# Patient Record
Sex: Female | Born: 1950 | Hispanic: No | Marital: Single | Smoking: Former smoker
Health system: Southern US, Community
[De-identification: ages and names within clinical notes are randomized; demographics above are authoritative.]

## PROBLEM LIST (undated history)

## (undated) DIAGNOSIS — J45909 Unspecified asthma, uncomplicated: Secondary | ICD-10-CM

## (undated) DIAGNOSIS — R0781 Pleurodynia: Secondary | ICD-10-CM

## (undated) DIAGNOSIS — I10 Essential (primary) hypertension: Secondary | ICD-10-CM

## (undated) DIAGNOSIS — I209 Angina pectoris, unspecified: Secondary | ICD-10-CM

## (undated) DIAGNOSIS — I2510 Atherosclerotic heart disease of native coronary artery without angina pectoris: Secondary | ICD-10-CM

## (undated) DIAGNOSIS — R7303 Prediabetes: Secondary | ICD-10-CM

## (undated) DIAGNOSIS — R0602 Shortness of breath: Secondary | ICD-10-CM

## (undated) DIAGNOSIS — M797 Fibromyalgia: Secondary | ICD-10-CM

## (undated) HISTORY — DX: Pleurodynia: R07.81

## (undated) HISTORY — DX: Unspecified asthma, uncomplicated: J45.909

## (undated) HISTORY — PX: CHOLECYSTECTOMY: SHX55

## (undated) HISTORY — DX: Shortness of breath: R06.02

## (undated) HISTORY — PX: NEPHRECTOMY: SHX65

## (undated) HISTORY — DX: Essential (primary) hypertension: I10

---

## 2015-12-26 ENCOUNTER — Other Ambulatory Visit: Payer: Self-pay

## 2015-12-26 DIAGNOSIS — Z1231 Encounter for screening mammogram for malignant neoplasm of breast: Secondary | ICD-10-CM

## 2016-01-23 ENCOUNTER — Ambulatory Visit
Admission: RE | Admit: 2016-01-23 | Discharge: 2016-01-23 | Disposition: A | Discharge status: Home / Self Care | Payer: 59 | Source: Ambulatory Visit

## 2016-01-23 DIAGNOSIS — Z1231 Encounter for screening mammogram for malignant neoplasm of breast: Secondary | ICD-10-CM

## 2016-02-02 ENCOUNTER — Other Ambulatory Visit: Payer: Self-pay | Admitting: Family Medicine

## 2016-02-02 DIAGNOSIS — R928 Other abnormal and inconclusive findings on diagnostic imaging of breast: Secondary | ICD-10-CM

## 2016-02-08 ENCOUNTER — Ambulatory Visit
Admission: RE | Admit: 2016-02-08 | Discharge: 2016-02-08 | Disposition: A | Discharge status: Home / Self Care | Payer: 59 | Source: Ambulatory Visit | Attending: Family Medicine | Admitting: Family Medicine

## 2016-02-08 DIAGNOSIS — R928 Other abnormal and inconclusive findings on diagnostic imaging of breast: Secondary | ICD-10-CM

## 2016-12-10 ENCOUNTER — Other Ambulatory Visit: Payer: Self-pay | Admitting: Cardiology

## 2016-12-10 DIAGNOSIS — R079 Chest pain, unspecified: Secondary | ICD-10-CM

## 2016-12-30 ENCOUNTER — Encounter (HOSPITAL_COMMUNITY): Payer: 59

## 2017-01-02 ENCOUNTER — Encounter: Payer: Self-pay | Admitting: Cardiovascular Disease

## 2017-01-02 ENCOUNTER — Ambulatory Visit (INDEPENDENT_AMBULATORY_CARE_PROVIDER_SITE_OTHER): Payer: Medicare HMO | Admitting: Cardiovascular Disease

## 2017-01-02 VITALS — BP 126/72 | HR 73 | Ht 67.0 in | Wt 216.2 lb

## 2017-01-02 DIAGNOSIS — J4520 Mild intermittent asthma, uncomplicated: Secondary | ICD-10-CM | POA: Diagnosis not present

## 2017-01-02 DIAGNOSIS — I10 Essential (primary) hypertension: Secondary | ICD-10-CM

## 2017-01-02 DIAGNOSIS — E669 Obesity, unspecified: Secondary | ICD-10-CM | POA: Diagnosis not present

## 2017-01-02 DIAGNOSIS — E1169 Type 2 diabetes mellitus with other specified complication: Secondary | ICD-10-CM | POA: Diagnosis not present

## 2017-01-02 DIAGNOSIS — R0602 Shortness of breath: Secondary | ICD-10-CM | POA: Diagnosis not present

## 2017-01-02 DIAGNOSIS — R079 Chest pain, unspecified: Secondary | ICD-10-CM

## 2017-01-02 DIAGNOSIS — R0781 Pleurodynia: Secondary | ICD-10-CM

## 2017-01-02 NOTE — Progress Notes (Signed)
Cardiology Office Note   Date:  01/12/2017   ID:  Mia Gonzales, DOB 06/29/1951, MRN [id number]  PCP:  Caffie Damme, MD  Cardiologist:   Chilton Si, MD   No chief complaint on file.     History of Present Illness: Mia Gonzales is a 66 y.o. female with hyperlipidemia, asthma, prior tobacco abuse, and diabetes who presents for an evaluation of shortness of breath. Mia Gonzales saw Dr. Caffie Damme on 12/28/16 and reported exertional dyspnea. Her blood pressure was noted to be elevated to 144/88, which was a new finding.  She was referred to cardiology for further evaluation.  She was also concerned because her older sister recently had a heart attack 3 weeks ago.  She also has a sister who had peripartum cardiomyopathy. Mia Gonzales noted an episode of pleuritic chest pain that started one month ago and lasted for 3 days. Mia discomfort was constant throughout that time. There was associated shortness of breath but no nausea or diaphoresis. This occurred in Mia setting of her dog dying.  She has not been exercising regularly but denies exertional chest pain or pressure. She does note some mild lower extremity edema that improves with elevation. She denies orthopnea or PND. She did not have any cold or cough symptoms at Mia time of her chest pain.  Mia Gonzales has several family members with cardiac disease.  She has a sister who had peripartum cardiomyopathy.  Another sister had CAD and a CABG at age 44.  Another sister developed CAD at age 70.  Mia Gonzales quit smoking 12 years ago after smoking 2 ppd for 30 days.   Past Medical History:  Diagnosis Date  . Asthma 01/06/2017  . Essential hypertension 01/06/2017  . Pleuritic chest pain 01/06/2017  . Shortness of breath 01/06/2017    History reviewed. No pertinent surgical history.   Current Outpatient Prescriptions  Medication Sig Dispense Refill  . cetirizine HCl (ZYRTEC) 5 MG/5ML SYRP Take 25 mg by mouth 2 (two) times daily.    . Cholecalciferol  (VITAMIN D3) 10000 units TABS Take 5,000 Units by mouth daily.    . diclofenac (VOLTAREN) 75 MG EC tablet Take 75 mg by mouth 2 (two) times daily.     Marland Kitchen gabapentin (NEURONTIN) 300 MG capsule Take 300 mg by mouth 2 (two) times daily.     . Multiple Vitamins-Minerals (ICAPS AREDS 2 PO) Take 1 tablet by mouth 2 (two) times daily.    Marland Kitchen tetrahydrozoline 0.05 % ophthalmic solution Place 1 drop into both eyes 4 (four) times daily. Equate dry eye relief Otc eye drops    . venlafaxine XR (EFFEXOR-XR) 75 MG 24 hr capsule Take 75 mg by mouth daily with breakfast.     . vitamin B-12 (CYANOCOBALAMIN) 500 MCG tablet Take 500 mcg by mouth daily.     No current facility-administered medications for this visit.     Allergies:   Tramadol-acetaminophen and Codeine    Social History:  Mia Gonzales  reports that she has quit smoking. She has never used smokeless tobacco.   Family History:  Mia Gonzales's family history includes CAD in her sister; Cancer in her father and mother; Cardiomyopathy in her sister; Diabetes in her father and paternal grandmother; Heart attack in her maternal grandfather and sister; Heart disease in her maternal grandfather and maternal grandmother; Rheum arthritis in her mother.    ROS:  Please see Mia history of present illness.   Otherwise, review of systems are positive for  none.   All other systems are reviewed and negative.    PHYSICAL EXAM: VS:  BP 126/72   Pulse 73   Ht 5\' 7"  (1.702 m)   Wt 98.1 kg (216 lb 3.2 oz)   BMI 33.86 kg/m  , BMI Body mass index is 33.86 kg/m. GENERAL:  Well appearing HEENT:  Pupils equal round and reactive, fundi not visualized, oral mucosa unremarkable NECK:  No jugular venous distention, waveform within normal limits, carotid upstroke brisk and symmetric, no bruits, no thyromegaly LYMPHATICS:  No cervical adenopathy LUNGS:  Clear to auscultation bilaterally HEART:  RRR.  PMI not displaced or sustained,S1 and S2 within normal limits, no S3, no  S4, no clicks, no rubs, no murmurs ABD:  Flat, positive bowel sounds normal in frequency in pitch, no bruits, no rebound, no guarding, no midline pulsatile mass, no hepatomegaly, no splenomegaly EXT:  2 plus pulses throughout, no edema, no cyanosis no clubbing SKIN:  No rashes no nodules NEURO:  Cranial nerves II through XII grossly intact, motor grossly intact throughout PSYCH:  Cognitively intact, oriented to person place and time    EKG:  EKG is ordered today. Mia ekg ordered today demonstrates sinus rhythm.  Rate 73 bpm.  PACs.     Recent Labs: No results found for requested labs within last 8760 hours.   11/07/16: A1c 7.2% Total cholesterol 256, triglycerides 314, HDL 33, LDL 169 sodium 141, potassium 4.2, BUN 21, creatinine 1.0 AST 15, ALT 13 WBC 10.1, hemoglobin 13.6, hematocrit 45.1, platelets 257 TSH 1.65 Free T4 1.41  Lipid Panel No results found for: CHOL, TRIG, HDL, CHOLHDL, VLDL, LDLCALC, LDLDIRECT    Wt Readings from Last 3 Encounters:  01/02/17 98.1 kg (216 lb 3.2 oz)      ASSESSMENT AND PLAN:  # Atypical chest pain: # Shortness of breath: Ms. Amburn had atypical symptoms that occurred in Mia setting of her dog dying.  She doesn't have exertional symptoms, so it is unlikely that this is due to ischemia.  We will get an ETT to better evaluate.  She has no evidence of heart failure on exam.  # Elevated blood pressure: Ms. Forget's BP is normal today but was elevated with her PCP.  Will continue to monitor.     Current medicines are reviewed at length with Mia Gonzales today.  Mia Gonzales does not have concerns regarding medicines.  Mia following changes have been made:  no change  Labs/ tests ordered today include:   Orders Placed This Encounter  Procedures  . Exercise Tolerance Test  . EKG 12-Lead     Disposition:   FU with Zeinab Rodwell C. Duke Salvia, MD, [facility] in 3 months.     This note was written with Mia assistance of speech recognition software.   Please excuse any transcriptional errors.  Signed, Raif Chachere C. Duke Salvia, MD, [facility]  01/12/2017 10:57 PM    [facility]

## 2017-01-02 NOTE — Patient Instructions (Signed)
Medication Instructions:  Your physician recommends that you continue on your current medications as directed. Please refer to the Current Medication list given to you today.  Labwork: NONE  Testing/Procedures: Your physician has requested that you have an exercise tolerance test. For further information please visit [email or web address] Please also follow instruction sheet, as given.  Follow-Up: Your physician wants you to follow-up in: 3 MONTH OV You will receive a reminder letter in the mail two months in advance. If you don't receive a letter, please call our office to schedule the follow-up appointment.  If you need a refill on your cardiac medications before your next appointment, please call your pharmacy.

## 2017-01-06 ENCOUNTER — Encounter: Payer: Self-pay | Admitting: Cardiovascular Disease

## 2017-01-06 DIAGNOSIS — I10 Essential (primary) hypertension: Secondary | ICD-10-CM

## 2017-01-06 DIAGNOSIS — J45909 Unspecified asthma, uncomplicated: Secondary | ICD-10-CM

## 2017-01-06 DIAGNOSIS — R0602 Shortness of breath: Secondary | ICD-10-CM

## 2017-01-06 DIAGNOSIS — E1169 Type 2 diabetes mellitus with other specified complication: Secondary | ICD-10-CM | POA: Insufficient documentation

## 2017-01-06 DIAGNOSIS — R0781 Pleurodynia: Secondary | ICD-10-CM | POA: Insufficient documentation

## 2017-01-06 DIAGNOSIS — E669 Obesity, unspecified: Secondary | ICD-10-CM

## 2017-01-06 HISTORY — DX: Pleurodynia: R07.81

## 2017-01-06 HISTORY — DX: Essential (primary) hypertension: I10

## 2017-01-06 HISTORY — DX: Unspecified asthma, uncomplicated: J45.909

## 2017-01-06 HISTORY — DX: Shortness of breath: R06.02

## 2017-01-09 ENCOUNTER — Telehealth (HOSPITAL_COMMUNITY): Payer: Self-pay

## 2017-01-09 NOTE — Telephone Encounter (Signed)
Encounter complete. 

## 2017-01-10 NOTE — Telephone Encounter (Signed)
complete

## 2017-01-14 ENCOUNTER — Ambulatory Visit (HOSPITAL_COMMUNITY)
Admission: RE | Admit: 2017-01-14 | Discharge: 2017-01-14 | Disposition: A | Discharge status: Home / Self Care | Payer: Medicare HMO | Source: Ambulatory Visit | Attending: Cardiovascular Disease | Admitting: Cardiovascular Disease

## 2017-01-14 DIAGNOSIS — R079 Chest pain, unspecified: Secondary | ICD-10-CM

## 2017-01-14 LAB — EXERCISE TOLERANCE TEST
CHL CUP MPHR: 154 {beats}/min
CSEPEDS: 46 s
CSEPHR: 92 %
CSEPPHR: 142 {beats}/min
Estimated workload: 7 METS
Exercise duration (min): 5 min
RPE: 19
Rest HR: 73 {beats}/min

## 2017-01-30 ENCOUNTER — Encounter: Payer: Self-pay | Admitting: *Deleted

## 2017-01-30 ENCOUNTER — Telehealth: Payer: Self-pay | Admitting: *Deleted

## 2017-01-30 NOTE — Telephone Encounter (Addendum)
-----   Message from Chilton Si, MD sent at 01/22/2017  4:50 PM EDT ----- Labs reviewed.  Cholesterol is quite elevated.  Recommend limiting fried and fatty foods.  Increase exercise to at least 30-40 minutes most days of the week.  Start rosuvastatin 40 mg daily.  Left message for pt to call

## 2017-01-30 NOTE — Telephone Encounter (Signed)
Spoke with pt, aware of dr Leonides Sake recommendations. She does not want a statin medication. She has changed her diet and is exercising more. She will discuss with dr Duke Salvia at next office visit.

## 2017-01-30 NOTE — Telephone Encounter (Signed)
New Message ° ° pt verbalized that she is returning call for rn °

## 2017-01-30 NOTE — Telephone Encounter (Signed)
This encounter was created in error - please disregard.

## 2017-04-08 ENCOUNTER — Ambulatory Visit: Payer: Medicare HMO | Admitting: Cardiovascular Disease

## 2017-04-09 ENCOUNTER — Other Ambulatory Visit: Payer: Self-pay | Admitting: Urology

## 2017-04-09 DIAGNOSIS — N2889 Other specified disorders of kidney and ureter: Secondary | ICD-10-CM

## 2017-08-16 ENCOUNTER — Inpatient Hospital Stay (HOSPITAL_COMMUNITY)
Admission: EM | Admit: 2017-08-16 | Discharge: 2017-08-19 | DRG: 247 | Disposition: A | Discharge status: Home / Self Care | Payer: Medicare HMO | Attending: Internal Medicine | Admitting: Internal Medicine

## 2017-08-16 ENCOUNTER — Other Ambulatory Visit: Payer: Self-pay

## 2017-08-16 ENCOUNTER — Encounter (HOSPITAL_COMMUNITY): Payer: Self-pay | Admitting: Emergency Medicine

## 2017-08-16 ENCOUNTER — Emergency Department (HOSPITAL_COMMUNITY): Payer: Medicare HMO

## 2017-08-16 DIAGNOSIS — E785 Hyperlipidemia, unspecified: Secondary | ICD-10-CM | POA: Diagnosis present

## 2017-08-16 DIAGNOSIS — Z885 Allergy status to narcotic agent status: Secondary | ICD-10-CM

## 2017-08-16 DIAGNOSIS — J019 Acute sinusitis, unspecified: Secondary | ICD-10-CM | POA: Diagnosis present

## 2017-08-16 DIAGNOSIS — Z87891 Personal history of nicotine dependence: Secondary | ICD-10-CM

## 2017-08-16 DIAGNOSIS — I129 Hypertensive chronic kidney disease with stage 1 through stage 4 chronic kidney disease, or unspecified chronic kidney disease: Secondary | ICD-10-CM | POA: Diagnosis present

## 2017-08-16 DIAGNOSIS — N179 Acute kidney failure, unspecified: Secondary | ICD-10-CM | POA: Diagnosis present

## 2017-08-16 DIAGNOSIS — I214 Non-ST elevation (NSTEMI) myocardial infarction: Principal | ICD-10-CM | POA: Diagnosis present

## 2017-08-16 DIAGNOSIS — R402252 Coma scale, best verbal response, oriented, at arrival to emergency department: Secondary | ICD-10-CM | POA: Diagnosis present

## 2017-08-16 DIAGNOSIS — Z8261 Family history of arthritis: Secondary | ICD-10-CM

## 2017-08-16 DIAGNOSIS — R402142 Coma scale, eyes open, spontaneous, at arrival to emergency department: Secondary | ICD-10-CM | POA: Diagnosis present

## 2017-08-16 DIAGNOSIS — Z833 Family history of diabetes mellitus: Secondary | ICD-10-CM

## 2017-08-16 DIAGNOSIS — Z85528 Personal history of other malignant neoplasm of kidney: Secondary | ICD-10-CM

## 2017-08-16 DIAGNOSIS — E7800 Pure hypercholesterolemia, unspecified: Secondary | ICD-10-CM | POA: Diagnosis present

## 2017-08-16 DIAGNOSIS — R079 Chest pain, unspecified: Secondary | ICD-10-CM | POA: Diagnosis present

## 2017-08-16 DIAGNOSIS — M797 Fibromyalgia: Secondary | ICD-10-CM | POA: Diagnosis present

## 2017-08-16 DIAGNOSIS — N182 Chronic kidney disease, stage 2 (mild): Secondary | ICD-10-CM | POA: Diagnosis present

## 2017-08-16 DIAGNOSIS — Z8249 Family history of ischemic heart disease and other diseases of the circulatory system: Secondary | ICD-10-CM

## 2017-08-16 DIAGNOSIS — I358 Other nonrheumatic aortic valve disorders: Secondary | ICD-10-CM | POA: Diagnosis present

## 2017-08-16 DIAGNOSIS — Z807 Family history of other malignant neoplasms of lymphoid, hematopoietic and related tissues: Secondary | ICD-10-CM

## 2017-08-16 DIAGNOSIS — Z955 Presence of coronary angioplasty implant and graft: Secondary | ICD-10-CM

## 2017-08-16 DIAGNOSIS — Z905 Acquired absence of kidney: Secondary | ICD-10-CM

## 2017-08-16 DIAGNOSIS — R402362 Coma scale, best motor response, obeys commands, at arrival to emergency department: Secondary | ICD-10-CM | POA: Diagnosis present

## 2017-08-16 DIAGNOSIS — I25110 Atherosclerotic heart disease of native coronary artery with unstable angina pectoris: Secondary | ICD-10-CM | POA: Diagnosis present

## 2017-08-16 DIAGNOSIS — E1122 Type 2 diabetes mellitus with diabetic chronic kidney disease: Secondary | ICD-10-CM | POA: Diagnosis present

## 2017-08-16 DIAGNOSIS — Z9049 Acquired absence of other specified parts of digestive tract: Secondary | ICD-10-CM

## 2017-08-16 DIAGNOSIS — J45909 Unspecified asthma, uncomplicated: Secondary | ICD-10-CM | POA: Diagnosis present

## 2017-08-16 HISTORY — DX: Fibromyalgia: M79.7

## 2017-08-16 LAB — BASIC METABOLIC PANEL
Anion gap: 11 (ref 5–15)
BUN: 25 mg/dL — ABNORMAL HIGH (ref 6–20)
CHLORIDE: 96 mmol/L — AB (ref 101–111)
CO2: 26 mmol/L (ref 22–32)
Calcium: 9 mg/dL (ref 8.9–10.3)
Creatinine, Ser: 1.44 mg/dL — ABNORMAL HIGH (ref 0.44–1.00)
GFR calc non Af Amer: 37 mL/min — ABNORMAL LOW (ref >60)
GFR, EST AFRICAN AMERICAN: 42 mL/min — AB (ref >60)
Glucose, Bld: 339 mg/dL — ABNORMAL HIGH (ref 65–99)
POTASSIUM: 4.3 mmol/L (ref 3.5–5.1)
SODIUM: 133 mmol/L — AB (ref 135–145)

## 2017-08-16 LAB — I-STAT TROPONIN, ED: Troponin i, poc: 0 ng/mL (ref 0.00–0.08)

## 2017-08-16 LAB — CBC
HEMATOCRIT: 43.6 % (ref 36.0–46.0)
Hemoglobin: 14.3 g/dL (ref 12.0–15.0)
MCH: 27.4 pg (ref 26.0–34.0)
MCHC: 32.8 g/dL (ref 30.0–36.0)
MCV: 83.5 fL (ref 78.0–100.0)
PLATELETS: 263 10*3/uL (ref 150–400)
RBC: 5.22 MIL/uL — AB (ref 3.87–5.11)
RDW: 14.5 % (ref 11.5–15.5)
WBC: 16.5 10*3/uL — AB (ref 4.0–10.5)

## 2017-08-16 LAB — D-DIMER, QUANTITATIVE (NOT AT ARMC): D DIMER QUANT: 0.51 ug{FEU}/mL — AB (ref 0.00–0.50)

## 2017-08-16 LAB — GLUCOSE, CAPILLARY: GLUCOSE-CAPILLARY: 225 mg/dL — AB (ref 65–99)

## 2017-08-16 LAB — TROPONIN I: TROPONIN I: 0.03 ng/mL — AB (ref <0.03)

## 2017-08-16 MED ORDER — ASPIRIN EC 81 MG PO TBEC
81.0000 mg | DELAYED_RELEASE_TABLET | Freq: Every day | ORAL | Status: DC
  Administered 2017-08-17 – 2017-08-19 (×2): 81 mg via ORAL
  Filled 2017-08-16 (×2): qty 1

## 2017-08-16 MED ORDER — ACETAMINOPHEN 325 MG PO TABS
650.0000 mg | ORAL_TABLET | Freq: Four times a day (QID) | ORAL | Status: DC | PRN
  Administered 2017-08-16 – 2017-08-18 (×3): 650 mg via ORAL
  Filled 2017-08-16 (×3): qty 2

## 2017-08-16 MED ORDER — PROMETHAZINE HCL 25 MG PO TABS: 12.5000 mg | ORAL_TABLET | Freq: Four times a day (QID) | ORAL | Status: DC | PRN

## 2017-08-16 MED ORDER — VENLAFAXINE HCL ER 75 MG PO CP24
75.0000 mg | ORAL_CAPSULE | Freq: Once | ORAL | Status: AC
  Administered 2017-08-16: 75 mg via ORAL
  Filled 2017-08-16: qty 1

## 2017-08-16 MED ORDER — GABAPENTIN 300 MG PO CAPS
300.0000 mg | ORAL_CAPSULE | Freq: Two times a day (BID) | ORAL | Status: DC
  Administered 2017-08-16 – 2017-08-19 (×6): 300 mg via ORAL
  Filled 2017-08-16 (×6): qty 1

## 2017-08-16 MED ORDER — PROCHLORPERAZINE EDISYLATE 5 MG/ML IJ SOLN
10.0000 mg | Freq: Once | INTRAMUSCULAR | Status: AC
  Administered 2017-08-16: 10 mg via INTRAVENOUS
  Filled 2017-08-16: qty 2

## 2017-08-16 MED ORDER — LACTATED RINGERS IV BOLUS (SEPSIS)
1000.0000 mL | Freq: Once | INTRAVENOUS | Status: AC
  Administered 2017-08-16: 1000 mL via INTRAVENOUS

## 2017-08-16 MED ORDER — INSULIN ASPART 100 UNIT/ML ~~LOC~~ SOLN
0.0000 [IU] | Freq: Three times a day (TID) | SUBCUTANEOUS | Status: DC
  Administered 2017-08-17: 2 [IU] via SUBCUTANEOUS
  Administered 2017-08-19: 07:00:00 1 [IU] via SUBCUTANEOUS

## 2017-08-16 MED ORDER — PANTOPRAZOLE SODIUM 40 MG PO TBEC
40.0000 mg | DELAYED_RELEASE_TABLET | Freq: Every day | ORAL | Status: DC
  Administered 2017-08-16 – 2017-08-19 (×4): 40 mg via ORAL
  Filled 2017-08-16 (×4): qty 1

## 2017-08-16 MED ORDER — NITROGLYCERIN 0.4 MG SL SUBL
0.4000 mg | SUBLINGUAL_TABLET | SUBLINGUAL | Status: DC | PRN
  Administered 2017-08-16 – 2017-08-18 (×4): 0.4 mg via SUBLINGUAL
  Filled 2017-08-16 (×4): qty 1

## 2017-08-16 MED ORDER — HEPARIN SODIUM (PORCINE) 5000 UNIT/ML IJ SOLN
5000.0000 [IU] | Freq: Three times a day (TID) | INTRAMUSCULAR | Status: DC
  Administered 2017-08-16 – 2017-08-17 (×2): 5000 [IU] via SUBCUTANEOUS
  Filled 2017-08-16 (×2): qty 1

## 2017-08-16 MED ORDER — SODIUM CHLORIDE 0.9% FLUSH
3.0000 mL | Freq: Two times a day (BID) | INTRAVENOUS | Status: DC
  Administered 2017-08-16 – 2017-08-17 (×2): 3 mL via INTRAVENOUS

## 2017-08-16 MED ORDER — DICLOFENAC SODIUM 1 % TD GEL
2.0000 g | Freq: Three times a day (TID) | TRANSDERMAL | Status: DC | PRN
  Administered 2017-08-16: 2 g via TOPICAL
  Filled 2017-08-16 (×2): qty 100

## 2017-08-16 MED ORDER — VENLAFAXINE HCL ER 75 MG PO CP24
75.0000 mg | ORAL_CAPSULE | Freq: Every day | ORAL | Status: DC
  Administered 2017-08-17 – 2017-08-18 (×2): 75 mg via ORAL
  Filled 2017-08-16 (×3): qty 1

## 2017-08-16 MED ORDER — ACETAMINOPHEN 650 MG RE SUPP: 650.0000 mg | Freq: Four times a day (QID) | RECTAL | Status: DC | PRN

## 2017-08-16 MED ORDER — ASPIRIN 81 MG PO CHEW
324.0000 mg | CHEWABLE_TABLET | Freq: Once | ORAL | Status: AC
  Administered 2017-08-16: 324 mg via ORAL
  Filled 2017-08-16: qty 4

## 2017-08-16 MED ORDER — DIPHENHYDRAMINE HCL 25 MG PO CAPS
25.0000 mg | ORAL_CAPSULE | Freq: Once | ORAL | Status: AC
  Administered 2017-08-16: 25 mg via ORAL
  Filled 2017-08-16: qty 1

## 2017-08-16 NOTE — H&P (Signed)
Date: 08/16/2017               Patient Name:  Mia Gonzales MRN: [id number]  DOB: 07/08/1951 Age / Sex: 66 y.o., female   PCP: Patient, No Pcp Per         Medical Service: Internal Medicine Teaching Service         Attending Physician: Dr. Doneen PoissonKlima, Lawrence, MD    First Contact: Dr. Lorenso CourierVahini Percell Lamboy Pager: [phone]  Second Contact: Dr. Deneise LeverParth Saraiya Pager: [phone]       After Hours (After 5p/  First Contact Pager: [phone]  weekends / holidays): Second Contact Pager: [phone]   Chief Complaint: Chest pain  History of Present Illness: [name] is a 66 y.o f with past medical history of essential hypertension, fibromyalgia, renal cell carcinoma s/p nephrectomy 03/2017, and asthma who presents with chest pain.  Patient states that she has had been having 3 weeks of chest pain that lasts 20 minutes usually, is intermittently present, 10/10 in intensity, present in the anterior chest and radiating to the right arm and bilateral jaws, tight in nature, and is non-pleuritic.The patient's chest pain does worsen with changes in position and the pain is accompanied with shortness of breath, diaphoresis, and nausea. She states that she does not have any accompanied palpitations, vomiting, or feeling of impending doom. She denies any heavy lifting, but states that she has occasional indigestion. The patient states that the nitroglycerin which she was given in the ED did help alleviate the pain.   The patient had an exercise tolerance test done in April 2018 which did not show any st segment deviation during the stress portion of the exam.   The patient was seen [facility] on 08/09/17 where she was diagnosed with costochondritis and given a prescription for capsaicin cream. The patient saw her pcp earlier this year and was diagnosed with sinusitis based on her frontal headaches, pain behind her ears, and occasional sneezing. She was prescribed cefdinir for 10 days.   ED course: EKG and chest  x-ray done Lactated ringer 1L, nitroglycerin 0.4 mg sublingual given  Meds:  Current Meds  Medication Sig  . cefdinir (OMNICEF) 300 MG capsule Take 300 mg by mouth 2 (two) times daily. Started 08/11/17  . Cholecalciferol (VITAMIN D3) 10000 units TABS Take 5,000 Units by mouth daily.  . diclofenac (VOLTAREN) 75 MG EC tablet Take 75 mg by mouth 2 (two) times daily.   Marland Kitchen. gabapentin (NEURONTIN) 300 MG capsule Take 300 mg by mouth 2 (two) times daily.   . Multiple Vitamins-Minerals (ICAPS AREDS 2 PO) Take 1 tablet by mouth 2 (two) times daily.  . predniSONE (DELTASONE) 10 MG tablet Take 10 mg by mouth. Started 08/15/17 4tab x 3days  3tab x 3days 2tab x 3days 1tab x 3days  . venlafaxine XR (EFFEXOR-XR) 75 MG 24 hr capsule Take 75 mg by mouth daily with breakfast.      Allergies: Allergies as of 08/16/2017 - Review Complete 08/16/2017  Allergen Reaction Noted  . Tramadol-acetaminophen    . Codeine Palpitations    Past Medical History:  Diagnosis Date  . Asthma 01/06/2017  . Essential hypertension 01/06/2017  . Fibromyalgia   . Pleuritic chest pain 01/06/2017  . Shortness of breath 01/06/2017    Family History:   Diabetes mellitus-Father Cardiomyopathy-sister Double bypass-sister Hodgkin's lymphoma-mother Prostate cancer with metastasis to the bone-father  Social History:  Lives alone Quit smoking 15 years ago.  Smokes 2 pack PPD  for 30 years Denies alcohol use Denies drug use  Review of Systems: A complete ROS was negative except as per HPI.   Physical Exam: Blood pressure (!) 164/72, pulse 93, temperature 97.9 F (36.6 C), temperature source Oral, resp. rate 13, height 5\' 7"  (1.702 m), weight 208 lb (94.3 kg), SpO2 97 %.  Physical Exam  Constitutional: She appears well-developed and well-nourished.  Non-toxic appearance. She does not appear ill.  HENT:  Head: Normocephalic and atraumatic.  No maxillary sinus tenderness  Cardiovascular: Normal rate, regular rhythm and  normal pulses.  Chest wall tenderness  Pulmonary/Chest: Effort normal and breath sounds normal. No accessory muscle usage. No respiratory distress.  Abdominal: Soft. Bowel sounds are normal. She exhibits no distension. There is no tenderness.  Lymphadenopathy:    She has no cervical adenopathy.  Neurological: She is alert.  Psychiatric: She has a normal mood and affect. Her behavior is normal. Her mood appears not anxious. She is not agitated.    Troponin (Point of Care Test) Recent Labs    08/16/17 1445  TROPIPOC 0.00   CBC    Component Value Date/Time   WBC 16.5 (H) 08/16/2017 1424   RBC 5.22 (H) 08/16/2017 1424   HGB 14.3 08/16/2017 1424   HCT 43.6 08/16/2017 1424   PLT 263 08/16/2017 1424   MCV 83.5 08/16/2017 1424   MCH 27.4 08/16/2017 1424   MCHC 32.8 08/16/2017 1424   RDW 14.5 08/16/2017 1424   BMP Latest Ref Rng & Units 08/16/2017  Glucose 65 - 99 mg/dL 960(A)  BUN 6 - 20 mg/dL 54(U)  Creatinine 9.81 - 1.00 mg/dL 1.91(Y)  Sodium 782 - 956 mmol/L 133(L)  Potassium 3.5 - 5.1 mmol/L 4.3  Chloride 101 - 111 mmol/L 96(L)  CO2 22 - 32 mmol/L 26  Calcium 8.9 - 10.3 mg/dL 9.0   O-ZHYQM=5.78  EKG: ST depression noted in V2, V4 to V6 shows inverted T waves   CXR: Active cardiopulmonary disease seen  Assessment & Plan by Problem:  66 y.o f with pmh of fibromyalgia, rcc, htn, hld, dm, and asthma who presents with a 3 week history of chest pain radiating down right arm and to the jaw. The patient's risk factors place significant concern for an ACS rule out and if negative consideration for costochondritis, GERD, pulmonary embolism, muscle sprain.   Chest pain The patient presents with a 3-week history of chest pain that is present intermittently.  The chest pain seems noncardiac in etiology as i-STAT troponin negative, chest wall tenderness was present, and the patient has had a long duration of the chest pain. However, the patient does have significant risk factors of  hypertension, obesity, positive family history, and age >45yrs which cause concern for the chest pain to be of cardiac origin. Furthermore, the patient has a heart score of 4 (age>65 and >3 risk factors) which place her at a 12-16.6% risk of a major cardiac event. The patient's EKG showed some ST depression in lead II. Chest x-ray was unremarkable. Previous stress test done April 2018 was negative for an ischemic changes. Have low suspicion for PE based on wells criteria being 0.   Based on the history the patient's chest pain is likely musculoskeletal in etiology and she would benefit from voltaren gel and protonix.   -D-dimer=0.51, patient is not tachypnic, tachycardic, or hypoxic. Wells score  -Consulted cardiology- will monitor troponins overnight and consult in morning (08/17/17) based on lab results -Continuous cardiac monitoring -Pantoprazole 40 mg daily -Trend  troponin every 6 hours -HbA1c pending -Lipid panel pending -Voltaren gel  Fibromyalgia -Continue gabapentin 300mg  bid -Continue venlafaxine 75mg  daily  Acute Sinusitis The patient was diagnosed with sinusitis on 08/11/17 and placed on cefdinir 300mg  bid. The patient's wbc=16.5 might be due to the sinus infection. The patient is afebrile.   -Continue cefdinir 300mg  bid   Hypertension The patient's blood pressure since admission has ranged 149-188/82-83  Diabetes Mellitus Type 2 The patient is not on any home anti-hyperglycemic agents. However, the patient has a glucose of 339 on admission today.   -pending Hba1c -SSI  Acute on chronic kidney injury The patient's creatinine on admission was 1.44 and baseline is 0.8-1.2 per chart review.   -Patient was given Lactated ringer 1L bolus  Dispo: Admit patient to Inpatient with expected length of stay greater than 2 midnights.  Signed: Lorenso Courier, MD Internal Medicine PGY1 [phone] 08/16/2017, 6:03 PM

## 2017-08-16 NOTE — ED Notes (Signed)
Pt c/o chest pain; 10/10, tightness and achy going behind bilat ears, jaw and right side of back; Pt says "I've never had that before." Pt states that pain is constant and c/o HA. Pt states the pain is worse when she is ambulatory.  Pt states that another MD diagnosed with Costochondritis. Pt states that she is on Prednisone. Pt denies smoking; DM, HTN. Pt c/o nausea only when ambulatory and some SOB.  MD at bedside.

## 2017-08-16 NOTE — ED Triage Notes (Addendum)
Pt c/o chest pain started this am at 1130, midsternal, radiating to left arm and neck. Pt was seen at [facility] last week, received capsacien cream, prednisone, Cefdinir, for same.  Pt had right nephrectomy 3 months ago for kidney cancer.

## 2017-08-16 NOTE — Progress Notes (Addendum)
Pt c/o 10/10 chest pain radiating to jaw and bilateral shoulders down to right arm.   2135. Nitro given  2138. Chest pain 6/10. Now c/o headache 10/10. Per oncall, okay to take sips with meds. Will administer tylenol PO.

## 2017-08-16 NOTE — ED Notes (Signed)
Admitting at bedside 

## 2017-08-16 NOTE — ED Notes (Signed)
Attempted report x1. 

## 2017-08-16 NOTE — ED Provider Notes (Signed)
[facility] EMERGENCY DEPARTMENT Provider Note   CSN: [id number] Arrival date & time: 08/16/17  1413     History   Chief Complaint Chief Complaint  Patient presents with  . Chest Pain    HPI Mia Gonzales is a 66 y.o. female.  HPI 66 year old female with a history of hypertension, asthma, diabetes, fibromyalgia presenting with 3 weeks of worsening chest pain and headache.  She states her headache has been gradual in onset and not described as worse of life.  Is mostly frontal and throbbing.  Associated with photophobia with no vision changes, numbness, weakness.  No recent falls or trauma.  Patient is also had a tightness pain in her chest that radiates to her right shoulder and arm that worsens on exertion and improves with rest. Associated with SOB. Denies diaphoresis or n/v. No history of cardiac disease.  Patient had a right radical nephrectomy 3 months ago for renal cell carcinoma.  Was seen recently at [facility] and told she had costochondritis and then saw her PCP and was placed on prednisone.  Past Medical History:  Diagnosis Date  . Asthma 01/06/2017  . Essential hypertension 01/06/2017  . Fibromyalgia   . Pleuritic chest pain 01/06/2017  . Shortness of breath 01/06/2017    Patient Active Problem List   Diagnosis Date Noted  . Chest pain 08/16/2017  . Essential hypertension 01/06/2017  . Asthma 01/06/2017  . Diabetes mellitus type 2 in obese (HCC) 01/06/2017  . Shortness of breath 01/06/2017  . Pleuritic chest pain 01/06/2017    Past Surgical History:  Procedure Laterality Date  . CHOLECYSTECTOMY    . NEPHRECTOMY      OB History    No data available       Home Medications    Prior to Admission medications   Medication Sig Start Date End Date Taking? Authorizing Provider  cefdinir (OMNICEF) 300 MG capsule Take 300 mg by mouth 2 (two) times daily. Started 08/11/17 08/11/17  Yes [provider]  Cholecalciferol (VITAMIN D3) 10000  units TABS Take 5,000 Units by mouth daily.   Yes [provider]  diclofenac (VOLTAREN) 75 MG EC tablet Take 75 mg by mouth 2 (two) times daily.  10/25/16  Yes [provider]  gabapentin (NEURONTIN) 300 MG capsule Take 300 mg by mouth 2 (two) times daily.  10/25/16  Yes [provider]  Multiple Vitamins-Minerals (ICAPS AREDS 2 PO) Take 1 tablet by mouth 2 (two) times daily.   Yes [provider]  predniSONE (DELTASONE) 10 MG tablet Take 10 mg by mouth. Started 08/15/17 4tab x 3days  3tab x 3days 2tab x 3days 1tab x 3days 08/15/17  Yes [provider]  venlafaxine XR (EFFEXOR-XR) 75 MG 24 hr capsule Take 75 mg by mouth daily with breakfast.  05/22/16  Yes [provider]    Family History Family History  Problem Relation Age of Onset  . Rheum arthritis Mother   . Cancer Mother   . Diabetes Father   . Cancer Father   . Heart disease Maternal Grandfather   . Heart attack Maternal Grandfather   . Heart disease Maternal Grandmother   . Diabetes Paternal Grandmother   . Heart attack Sister   . Cardiomyopathy Sister   . CAD Sister     Social History Social History   Tobacco Use  . Smoking status: Former Games developer  . Smokeless tobacco: Never Used  Substance Use Topics  . Alcohol use: No  Frequency: Never  . Drug use: No     Allergies   Tramadol-acetaminophen and Codeine   Review of Systems Review of Systems  Constitutional: Negative for chills and fever.  HENT: Negative for ear pain and sore throat.   Eyes: Negative for pain and visual disturbance.  Respiratory: Positive for shortness of breath. Negative for cough.   Cardiovascular: Positive for chest pain. Negative for palpitations.  Gastrointestinal: Negative for abdominal pain and vomiting.  Genitourinary: Negative for dysuria and hematuria.  Musculoskeletal: Negative for arthralgias and back pain.  Skin: Negative for color change and rash.  Neurological: Positive for  headaches. Negative for dizziness, seizures, syncope, weakness and numbness.  All other systems reviewed and are negative.    Physical Exam Updated Vital Signs BP (!) 164/72   Pulse 93   Temp 97.9 F (36.6 C) (Oral)   Resp 13   Ht 5\' 7"  (1.702 m)   Wt 94.3 kg (208 lb)   SpO2 97%   BMI 32.58 kg/m   Physical Exam  Constitutional: She is oriented to person, place, and time. She appears well-developed and well-nourished. No distress.  HENT:  Head: Normocephalic and atraumatic.  Eyes: Conjunctivae and EOM are normal. Pupils are equal, round, and reactive to light.  Neck: Neck supple.  Cardiovascular: Normal rate, regular rhythm, S1 normal, S2 normal, normal heart sounds, intact distal pulses and normal pulses. Exam reveals no gallop, no friction rub and no decreased pulses.  No murmur heard. Pulmonary/Chest: Effort normal and breath sounds normal. No respiratory distress.  Abdominal: Soft. There is no tenderness.  Musculoskeletal: She exhibits no edema.  Neurological: She is alert and oriented to person, place, and time. GCS eye subscore is 4. GCS verbal subscore is 5. GCS motor subscore is 6.  5/5 strength in all extremities. Sensation intact throughout. CN2-12 grossly intact. Coordination intact with finger to nose. Normal gait.  Skin: Skin is warm and dry.  Psychiatric: She has a normal mood and affect.  Nursing note and vitals reviewed.    ED Treatments / Results  Labs (all labs ordered are listed, but only abnormal results are displayed) Labs Reviewed  BASIC METABOLIC PANEL - Abnormal; Notable for the following components:      Result Value   Sodium 133 (*)    Chloride 96 (*)    Glucose, Bld 339 (*)    BUN 25 (*)    Creatinine, Ser 1.44 (*)    GFR calc non Af Amer 37 (*)    GFR calc Af Amer 42 (*)    All other components within normal limits  CBC - Abnormal; Notable for the following components:   WBC 16.5 (*)    RBC 5.22 (*)    All other components within normal  limits  D-DIMER, QUANTITATIVE (NOT AT [facility]) - Abnormal; Notable for the following components:   D-Dimer, Quant 0.51 (*)    All other components within normal limits  TROPONIN I  TROPONIN I  TROPONIN I  LIPID PANEL  HEMOGLOBIN A1C  I-STAT TROPONIN, ED    EKG  EKG Interpretation  Date/Time:  Saturday August 16 2017 14:17:16 EST Ventricular Rate:  95 PR Interval:  138 QRS Duration: 78 QT Interval:  342 QTC Calculation: 429 R Axis:   70 Text Interpretation:  Normal sinus rhythm Nonspecific ST and T wave abnormality Abnormal ECG Confirmed by Blane Ohara [phone]) on 08/16/2017 5:20:49 PM       Radiology Dg Chest 2 View  Result Date: 08/16/2017 CLINICAL  DATA:  66 year old female with chest pain, midsternal radiating to left arm and neck. EXAM: CHEST  2 VIEW COMPARISON:  None. FINDINGS: The heart size and mediastinal contours are within normal limits. Both lungs are clear. The visualized skeletal structures are unremarkable. IMPRESSION: No active cardiopulmonary disease. Electronically Signed   By: Sande BrothersSerena  Chacko M.D.   On: 08/16/2017 15:23    Procedures Procedures (including critical care time)  Medications Ordered in ED Medications  nitroGLYCERIN (NITROSTAT) SL tablet 0.4 mg (0.4 mg Sublingual Given 08/16/17 1645)  lactated ringers bolus 1,000 mL (0 mLs Intravenous Paused 08/16/17 1839)  pantoprazole (PROTONIX) EC tablet 40 mg (40 mg Oral Given 08/16/17 1839)  diphenhydrAMINE (BENADRYL) capsule 25 mg (25 mg Oral Given 08/16/17 1735)  prochlorperazine (COMPAZINE) injection 10 mg (10 mg Intravenous Given 08/16/17 1735)  aspirin chewable tablet 324 mg (324 mg Oral Given 08/16/17 1839)     Initial Impression / Assessment and Plan / ED Course  I have reviewed the triage vital signs and the nursing notes.  Pertinent labs & imaging results that were available during my care of the patient were reviewed by me and considered in my medical decision making (see chart for  details).     66 year old female with the above history presenting with chest pain and a headache.  Headache has been gradual in onset over 3 weeks and not described the source of life.  Normal neurologic exam.  Afebrile.  Doubt SAH/ICH.  No recent trauma. Chest pain resolved with nitro.  Aspirin given.  EKG with nonspecific T wave changes in the lateral leads.  She was tachycardic but not hypoxic.  No clinical evidence of DVT. Labs with a mild elevation in her creatinine at 1.4.  Initial troponin negative.  D-dimer ordered to rule out PE.  D-dimer is 0.5 which age-adjusted is negative.  Chest x-ray is grossly unremarkable with no evidence of pneumonia, pneumothorax, widened mediastinum cardiomegaly. Heart score is 5. Patient given Compazine and Benadryl for headache.  Fluid bolus given for AK I.  Patient will be admitted to medicine for ACS rule out. Pt amenable with plan  Final Clinical Impressions(s) / ED Diagnoses   Final diagnoses:  Chest pain, unspecified type    ED Discharge Orders    None       Khadir Roam Italyhad, MD 08/16/17 [id number]    Blane OharaZavitz, Joshua, MD 08/16/17 2326

## 2017-08-16 NOTE — ED Notes (Signed)
Pt states that chest pain relieved by nitro x 1 dose; still c/o of HA

## 2017-08-17 ENCOUNTER — Other Ambulatory Visit (HOSPITAL_COMMUNITY): Payer: Medicare HMO

## 2017-08-17 DIAGNOSIS — E7800 Pure hypercholesterolemia, unspecified: Secondary | ICD-10-CM | POA: Diagnosis not present

## 2017-08-17 DIAGNOSIS — I10 Essential (primary) hypertension: Secondary | ICD-10-CM | POA: Diagnosis not present

## 2017-08-17 DIAGNOSIS — I214 Non-ST elevation (NSTEMI) myocardial infarction: Secondary | ICD-10-CM

## 2017-08-17 DIAGNOSIS — R079 Chest pain, unspecified: Secondary | ICD-10-CM

## 2017-08-17 LAB — CBC
HCT: 40 % (ref 36.0–46.0)
HEMATOCRIT: 40 % (ref 36.0–46.0)
Hemoglobin: 12.6 g/dL (ref 12.0–15.0)
Hemoglobin: 12.8 g/dL (ref 12.0–15.0)
MCH: 26.7 pg (ref 26.0–34.0)
MCH: 26.9 pg (ref 26.0–34.0)
MCHC: 31.5 g/dL (ref 30.0–36.0)
MCHC: 32 g/dL (ref 30.0–36.0)
MCV: 84.7 fL (ref 78.0–100.0)
MCV: 84.2 fL (ref 78.0–100.0)
PLATELETS: 234 10*3/uL (ref 150–400)
PLATELETS: 223 10*3/uL (ref 150–400)
RBC: 4.72 MIL/uL (ref 3.87–5.11)
RBC: 4.75 MIL/uL (ref 3.87–5.11)
RDW: 15 % (ref 11.5–15.5)
RDW: 14.8 % (ref 11.5–15.5)
WBC: 14.7 10*3/uL — ABNORMAL HIGH (ref 4.0–10.5)
WBC: 15.7 10*3/uL — AB (ref 4.0–10.5)

## 2017-08-17 LAB — LIPID PANEL
CHOL/HDL RATIO: 6.6 ratio
CHOLESTEROL: 205 mg/dL — AB (ref 0–200)
HDL: 31 mg/dL — ABNORMAL LOW (ref >40)
LDL Cholesterol: 121 mg/dL — ABNORMAL HIGH (ref 0–99)
TRIGLYCERIDES: 267 mg/dL — AB (ref <150)
VLDL: 53 mg/dL — ABNORMAL HIGH (ref 0–40)

## 2017-08-17 LAB — COMPREHENSIVE METABOLIC PANEL
ALK PHOS: 85 U/L (ref 38–126)
ALT: 18 U/L (ref 14–54)
AST: 18 U/L (ref 15–41)
Albumin: 3.3 g/dL — ABNORMAL LOW (ref 3.5–5.0)
Anion gap: 8 (ref 5–15)
BUN: 28 mg/dL — ABNORMAL HIGH (ref 6–20)
CALCIUM: 8.4 mg/dL — AB (ref 8.9–10.3)
CHLORIDE: 103 mmol/L (ref 101–111)
CO2: 26 mmol/L (ref 22–32)
Creatinine, Ser: 1.18 mg/dL — ABNORMAL HIGH (ref 0.44–1.00)
GFR, EST AFRICAN AMERICAN: 54 mL/min — AB (ref >60)
GFR, EST NON AFRICAN AMERICAN: 47 mL/min — AB (ref >60)
Glucose, Bld: 127 mg/dL — ABNORMAL HIGH (ref 65–99)
Potassium: 3.7 mmol/L (ref 3.5–5.1)
Sodium: 137 mmol/L (ref 135–145)
Total Bilirubin: 0.5 mg/dL (ref 0.3–1.2)
Total Protein: 6.3 g/dL — ABNORMAL LOW (ref 6.5–8.1)

## 2017-08-17 LAB — TROPONIN I
TROPONIN I: 0.13 ng/mL — AB (ref <0.03)
TROPONIN I: 0.08 ng/mL — AB (ref <0.03)
TROPONIN I: 0.13 ng/mL — AB (ref <0.03)
Troponin I: 0.17 ng/mL (ref <0.03)
Troponin I: 0.14 ng/mL (ref <0.03)

## 2017-08-17 LAB — GLUCOSE, CAPILLARY
GLUCOSE-CAPILLARY: 142 mg/dL — AB (ref 65–99)
GLUCOSE-CAPILLARY: 152 mg/dL — AB (ref 65–99)
Glucose-Capillary: 168 mg/dL — ABNORMAL HIGH (ref 65–99)
Glucose-Capillary: 155 mg/dL — ABNORMAL HIGH (ref 65–99)

## 2017-08-17 LAB — HEPARIN LEVEL (UNFRACTIONATED): Heparin Unfractionated: 0.38 IU/mL (ref 0.30–0.70)

## 2017-08-17 MED ORDER — HEPARIN (PORCINE) IN NACL 100-0.45 UNIT/ML-% IJ SOLN
1000.0000 [IU]/h | INTRAMUSCULAR | Status: DC
  Administered 2017-08-17: 850 [IU]/h via INTRAVENOUS
  Administered 2017-08-18: 1000 [IU]/h via INTRAVENOUS
  Filled 2017-08-17 (×2): qty 250

## 2017-08-17 MED ORDER — METOPROLOL TARTRATE 25 MG PO TABS
25.0000 mg | ORAL_TABLET | Freq: Two times a day (BID) | ORAL | Status: DC
  Administered 2017-08-17 (×2): 25 mg via ORAL
  Filled 2017-08-17 (×3): qty 1

## 2017-08-17 MED ORDER — ATORVASTATIN CALCIUM 40 MG PO TABS
40.0000 mg | ORAL_TABLET | Freq: Every day | ORAL | Status: DC
  Administered 2017-08-17 – 2017-08-18 (×2): 40 mg via ORAL
  Filled 2017-08-17 (×2): qty 1

## 2017-08-17 MED ORDER — HEPARIN BOLUS VIA INFUSION
2000.0000 [IU] | Freq: Once | INTRAVENOUS | Status: AC
  Administered 2017-08-17: 2000 [IU] via INTRAVENOUS
  Filled 2017-08-17: qty 2000

## 2017-08-17 NOTE — Progress Notes (Signed)
   Subjective: Mia Gonzales was seen laying in her bed this morning.  She states that she is currently doing well without any chest pain or shortness of breath.  However, the patient reports an episode of 10/10 chest pain that occurred overnight and was relieved with nitroglycerin.  The patient states that the chest pain was not pleuritic, but was positional.  Objective:  Vital signs in last 24 hours: Vitals:   08/16/17 2004 08/16/17 2134 08/16/17 2150 08/17/17 0552  BP: (!) 162/75 (!) 174/91 (!) 146/69 129/74  Pulse: 78 95 86 66  Resp: 18   20  Temp: 98.1 F (36.7 C)   98 F (36.7 C)  TempSrc: Oral   Oral  SpO2: 100%   95%  Weight:    216 lb 9.6 oz (98.2 kg)  Height:       Physical Exam  Constitutional: She appears well-developed and well-nourished.  Non-toxic appearance. She does not appear ill.  HENT:  Head: Normocephalic and atraumatic.  Cardiovascular: Normal rate, regular rhythm and normal pulses.  Chest wall tenderness to palpation  Pulmonary/Chest: Effort normal and breath sounds normal. No accessory muscle usage. No respiratory distress. She has no decreased breath sounds.  Abdominal: Soft. Bowel sounds are normal.  Musculoskeletal: Normal range of motion.  Neurological: She is alert.  Psychiatric: She has a normal mood and affect. Her behavior is normal. Her mood appears not anxious. She is not agitated.   Assessment/Plan:  Unstable angina/NSTEMI The patient presents with a 3-week history of chest pain that is present intermittently. Troponin has been uptrending from 0,0.03, 0.08, 0.17.  Accompanied EKG changes with some ST depression noted in lead II also concerning for ACS.  -Cardiology consulted they have ordered Myoview stress test  -Continue heparin drip -Pending TTE -Trend troponin every 6 hours until the troponins have peaked or start downtrending -Continuous cardiac monitoring -Pantoprazole 40 mg daily -HbA1c pending -Lipid panel: Tcholesterol=205,  triglycerides=267, LDL=121, HDL=31 -Voltaren gel -Continue atorvastatin 40 mg daily continue aspirin 324 mg  Essential hypertension The patient's blood pressure since admission has ranged 149-188/82-83 -Started metoprolol 25 mg twice daily  ?Diabetes mellitus The patient has a questionable diagnosis of diabetes and she is not taking any hypoglycemic agents at home.  She denies any symptoms of polyuria, polydipsia, polyphagia. The patient states that she has had some weight gain.   The patient's fasting blood sugar this morning was 142.  Her random blood glucose measurements have been ranging 168-339  Another fasting blood glucose level>126 or hemoglobin A1c>6.5 to be elevated for an official diagnosis of diabetes to be made.  -Pending HbA1C  Fibromyalgia -Continue gabapentin 300mg  bid -Continue venlafaxine 75mg  daily  Dispo: Anticipated discharge in approximately 1-2 days.   Lorenso Courier, MD Internal Medicine PGY1 Pager:[phone] 08/17/2017, 12:14 PM

## 2017-08-17 NOTE — Progress Notes (Signed)
Paged by nursing @[phone]  that patient experienced 8/10 substernal CP radiating to her right arm when she got up and walked to the bathroom. Vitals were stable with BP 129/67, HR 59. Went to bedside and patient said the pain was relieved by SL nitro and it was the same pain she has been experiencing since admission. Repeat EKG was obtained and showed no ST elevations or evidence of acute ischemia when compared to prior ECG this AM. Troponin level will be obtained,  will follow up result.   Juluis Mire, MD Internal Medicine PGY1 Pager # [phone]

## 2017-08-17 NOTE — Progress Notes (Addendum)
ANTICOAGULATION CONSULT NOTE - Follow-up  Pharmacy Consult for Heparin  Indication: chest pain/ACS  Allergies  Allergen Reactions  . Tramadol-Acetaminophen     Other reaction(s): Vomiting  . Codeine Palpitations   Patient Measurements: Height: 5\' 7"  (170.2 cm) Weight: 216 lb 9.6 oz (98.2 kg) IBW/kg (Calculated) : 61.6  Vital Signs: Temp: 98 F (36.7 C) (11/25 0552) Temp Source: Oral (11/25 0552) BP: 129/74 (11/25 0552) Pulse Rate: 66 (11/25 0552)  Labs: Recent Labs    08/16/17 1424  08/17/17 0015 08/17/17 0749 08/17/17 1244  HGB 14.3  --   --  12.6  --   HCT 43.6  --   --  40.0  --   PLT 263  --   --  234  --   HEPARINUNFRC  --   --   --   --  0.38  CREATININE 1.44*  --   --  1.18*  --   TROPONINI  --    < > 0.08* 0.17* 0.14*   < > = values in this interval not displayed.   Estimated Creatinine Clearance: 55.7 mL/min (A) (by C-G formula based on SCr of 1.18 mg/dL (H)).  Medical History: Past Medical History:  Diagnosis Date  . Asthma 01/06/2017  . Essential hypertension 01/06/2017  . Fibromyalgia   . Pleuritic chest pain 01/06/2017  . Shortness of breath 01/06/2017   Assessment: 66 y/o F with CP and mildly elevated troponin, likely unstable angina/NSTEMI. CBC wnl. Noted renal dysfunction (has one kidney). Not on PTA anticoagulation.  First 6-hr heparin level is therapeutic at 0.38. No bleeding reported.   Goal of Therapy:  Heparin level 0.3-0.7 units/ml Monitor platelets by anticoagulation protocol: Yes   Plan:  Continue Heparin gtt at 850 units/hr Daily CBC/HL Monitor for bleeding  Adline Potter, PharmD Pharmacy Resident Pager: [phone]

## 2017-08-17 NOTE — Progress Notes (Signed)
Pt c/o 8/10 CP, mid sternal pain radiating to right arm. States pain started when ambulating to bathroom. Pt states this pain mimics the same pain that she has been having and what brought her to the hospital. Pt states pain usually resolves with SL nitro. BP 129/67 (87), 1 SL nitro given. EKG obtained-normal EKG. 1 SL nitro has decreased pain from 8/10 to 0/10. MD (IM Teaching Service on call) now at bedside. New lab orders obtained. Will continue to monitor closely.  Viviano Simas, RN

## 2017-08-17 NOTE — Progress Notes (Signed)
Progress Note  Patient Name: Mia Gonzales Date of Encounter: 08/17/2017  Primary Cardiologist: Duke Salvia  Subjective   No pain currently   Inpatient Medications    Scheduled Meds: . aspirin EC  81 mg Oral Daily  . gabapentin  300 mg Oral BID  . insulin aspart  0-9 Units Subcutaneous TID WC  . metoprolol tartrate  25 mg Oral BID  . pantoprazole  40 mg Oral Daily  . sodium chloride flush  3 mL Intravenous Q12H  . venlafaxine XR  75 mg Oral Q breakfast   Continuous Infusions: . heparin 850 Units/hr (08/17/17 0656)   PRN Meds: acetaminophen **OR** acetaminophen, diclofenac sodium, nitroGLYCERIN, promethazine   Vital Signs    Vitals:   08/16/17 2004 08/16/17 2134 08/16/17 2150 08/17/17 0552  BP: (!) 162/75 (!) 174/91 (!) 146/69 129/74  Pulse: 78 95 86 66  Resp: 18   20  Temp: 98.1 F (36.7 C)   98 F (36.7 C)  TempSrc: Oral   Oral  SpO2: 100%   95%  Weight:    216 lb 9.6 oz (98.2 kg)  Height:        Intake/Output Summary (Last 24 hours) at 08/17/2017 0915 Last data filed at 08/17/2017 0200 Gross per 24 hour  Intake 3 ml  Output 0 ml  Net 3 ml   Filed Weights   08/16/17 2001 08/16/17 2002 08/17/17 0552  Weight: 215 lb 3.2 oz (97.6 kg) 215 lb 3.2 oz (97.6 kg) 216 lb 9.6 oz (98.2 kg)    Telemetry    NSR no arrhythmia - Personally Reviewed  ECG    SR no acute ST changes - Personally Reviewed  Physical Exam  Obes white female  GEN: No acute distress.   Neck: No JVD Cardiac: RRR, no murmurs, rubs, or gallops.  Respiratory: Clear to auscultation bilaterally. GI: Soft, nontender, non-distended large nephrectomy/GB scar  MS: No edema; No deformity. Neuro:  Nonfocal  Psych: Normal affect   Labs    Chemistry Recent Labs  Lab 08/16/17 1424  NA 133*  K 4.3  CL 96*  CO2 26  GLUCOSE 339*  BUN 25*  CREATININE 1.44*  CALCIUM 9.0  GFRNONAA 37*  GFRAA 42*  ANIONGAP 11     Hematology Recent Labs  Lab 08/16/17 1424 08/17/17 0749  WBC 16.5*  14.7*  RBC 5.22* 4.72  HGB 14.3 12.6  HCT 43.6 40.0  MCV 83.5 84.7  MCH 27.4 26.7  MCHC 32.8 31.5  RDW 14.5 15.0  PLT 263 234    Cardiac Enzymes Recent Labs  Lab 08/16/17 1845 08/17/17 0015  TROPONINI 0.03* 0.08*    Recent Labs  Lab 08/16/17 1445  TROPIPOC 0.00     BNPNo results for input(s): BNP, PROBNP in the last 168 hours.   DDimer  Recent Labs  Lab 08/16/17 1635  DDIMER 0.51*     Radiology    Dg Chest 2 View  Result Date: 08/16/2017 CLINICAL DATA:  66 year old female with chest pain, midsternal radiating to left arm and neck. EXAM: CHEST  2 VIEW COMPARISON:  None. FINDINGS: The heart size and mediastinal contours are within normal limits. Both lungs are clear. The visualized skeletal structures are unremarkable. IMPRESSION: No active cardiopulmonary disease. Electronically Signed   By: Sande Brothers M.D.   On: 08/16/2017 15:23    Cardiac Studies   Echo pending ETT 4/254/18 normal   Patient Profile     66 y.o. female with atypical chest pain Prolonged with negative troponin  and no ECG Changes Nephrectomy with single kidney and Cr around 1.4 Normal ETT 12/2016  Assessment & Plan    Chest Pain: Atypical r/o have ordered Ex myovue for am try to avoid contrast load Echo is pending for today   CRF:  Post nephrectomy with "cure" will need to be hydrated if cath needed  For questions or updates, please contact [facility] Please consult [email or web address] for contact info under Cardiology/STEMI.      Signed, Charlton HawsPeter Aela Bohan, MD  08/17/2017, 9:15 AM

## 2017-08-17 NOTE — H&P (Signed)
Internal Medicine Attending Admission Note Date: 08/17/2017  Patient name: Mia Gonzales Medical record number: [id number] Date of birth: 04/27/1951 Age: 66 y.o. Gender: female  I saw and evaluated the patient. I reviewed the resident's note and I agree with the resident's findings and plan as documented in the resident's note.  Chief Complaint(s): Intermittent chest pain with exertion 3 weeks.  History - key components related to admission:  Ms. Mia Gonzales is a 66 year old woman with a history of essential hypertension, asthma, fibromyalgia, hyperlipidemia, possible diabetes, previous tobacco abuse and renal cell carcinoma status post nephrectomy in July 2018 who presents with a three-week history of intermittent exertional chest pain. She describes the pain is 10/10 substernal tightness that radiates to the right arm and jaw associated with shortness of breath, diaphoresis, and nausea. The pain resolves with rest. She has another pain that is brought on by palpation of the chest wall and has previously been diagnosed as costochondritis. In addition to her hypertension, hyperlipidemia, prior tobacco use, and possible newly diagnosed diabetes she has a family history of premature coronary artery disease with diagnosis in her sister while in her 30s. She presented to the emergency department with an episode of chest pain which was relieved with sublingual nitroglycerin. She was admitted to the internal medicine teaching service for further evaluation and care.  When seen on rounds the morning after admission she denied any further chest pain and had no acute complaints. Her serial troponins peaked at 0.17. Prior exercise tolerance test in April 2018 was interpreted as negative.  Physical Exam - key components related to admission:  Vitals:   08/16/17 2134 08/16/17 2150 08/17/17 0552 08/17/17 1455  BP: (!) 174/91 (!) 146/69 129/74 95/80  Pulse: 95 86 66 (!) 58  Resp:   20   Temp:   98 F (36.7 C) 98 F  (36.7 C)  TempSrc:   Oral Oral  SpO2:   95% 97%  Weight:   216 lb 9.6 oz (98.2 kg)   Height:       Gen.: Well-developed, well-nourished, woman lying comfortably bed in no acute distress. Lungs: Clear to auscultation bilaterally without wheezes, rhonchi, rales. Heart: Regular rate and rhythm without murmurs, rubs, or gallops. Abdomen: Soft, nontender, active bowel sounds without guarding. Extremities: Without edema.  Lab results:  Basic Metabolic Panel: Recent Labs    08/16/17 1424 08/17/17 0749  NA 133* 137  K 4.3 3.7  CL 96* 103  CO2 26 26  GLUCOSE 339* 127*  BUN 25* 28*  CREATININE 1.44* 1.18*  CALCIUM 9.0 8.4*   Liver Function Tests: Recent Labs    08/17/17 0749  AST 18  ALT 18  ALKPHOS 85  BILITOT 0.5  PROT 6.3*  ALBUMIN 3.3*   CBC: Recent Labs    08/16/17 1424 08/17/17 0749  WBC 16.5* 14.7*  HGB 14.3 12.6  HCT 43.6 40.0  MCV 83.5 84.7  PLT 263 234   Cardiac Enzymes: Recent Labs    08/17/17 0015 08/17/17 0749 08/17/17 1244  TROPONINI 0.08* 0.17* 0.14*   D-Dimer: Recent Labs    08/16/17 1635  DDIMER 0.51*   CBG: Recent Labs    08/16/17 2152 08/17/17 0733 08/17/17 1113  GLUCAP 225* 142* 168*   Fasting Lipid Panel: Recent Labs    08/17/17 0015  CHOL 205*  HDL 31*  LDLCALC 121*  TRIG 267*  CHOLHDL 6.6   Misc. Labs:  Hemoglobin A1c pending  Imaging results:   PA and lateral chest x-ray: No effusions,  infiltrates, or masses.  Other results:  EKG: Normal sinus rhythm at 95 bpm, normal axis, normal intervals, no significant Q waves, no LVH by voltage, good R wave progression, nonspecific ST and T-wave changes. The admission ECG was not significantly different from the ECG on 01/02/2017. The ECG today had a more defined T-wave inversion pattern in the anterolateral leads.  Assessment & Plan by Problem:  Mia Gonzales is a 66 year old woman with a history of essential hypertension, asthma, fibromyalgia, hyperlipidemia, possible  diabetes, previous tobacco abuse and renal cell carcinoma status post nephrectomy in July 2018 who presents with a three-week history of intermittent exertional chest pain. Her pain is somewhat atypical and she has a mild troponin leak but does not have any acute ECG changes. Given her multiple cardiac risk factors, her presentation, and her mild troponin leak she may have had a very mild non-ST elevation myocardial infarction. Further evaluation is warranted and we appreciate cardiology's input. Given the fact that she has only one kidney, and the ever present concern for contrast nephropathy, Cardiology feels it would be more prudent to start with a Myoview to assess for reversible ischemia.  1) Very mild non-ST elevation myocardial infarction: She has been placed on IV heparin. We will continue the aspirin. Atorvastatin 40 mg by mouth daily has been started as the high intensity statin. For her blood pressure we have started metoprolol 25 mg by mouth twice daily. She is scheduled to undergo a Myoview tomorrow. We will also obtain an echocardiogram to look for any structural abnormalities or focal akinesis. She has had one fasting blood sugar over 125 although recently had been on prednisone. The prednisone has been discontinued. Thus, a hemoglobin A1c is pending. If she meets criteria for diabetes and requires anti-glycemic medications these will be started once it has been determined she does not need contrast or after successfully navigating a contrasted study.  2) Disposition: Pending the results of the Myoview and need for any further evaluation.

## 2017-08-17 NOTE — Progress Notes (Addendum)
ANTICOAGULATION CONSULT NOTE - Initial Consult  Pharmacy Consult for Heparin  Indication: chest pain/ACS  Allergies  Allergen Reactions  . Tramadol-Acetaminophen     Other reaction(s): Vomiting  . Codeine Palpitations   Patient Measurements: Height: 5\' 7"  (170.2 cm) Weight: 216 lb 9.6 oz (98.2 kg) IBW/kg (Calculated) : 61.6  Vital Signs: Temp: 98 F (36.7 C) (11/25 0552) Temp Source: Oral (11/25 0552) BP: 129/74 (11/25 0552) Pulse Rate: 66 (11/25 0552)  Labs: Recent Labs    08/16/17 1424 08/16/17 1845 08/17/17 0015  HGB 14.3  --   --   HCT 43.6  --   --   PLT 263  --   --   CREATININE 1.44*  --   --   TROPONINI  --  0.03* 0.08*   Estimated Creatinine Clearance: 45.6 mL/min (A) (by C-G formula based on SCr of 1.44 mg/dL (H)).  Medical History: Past Medical History:  Diagnosis Date  . Asthma 01/06/2017  . Essential hypertension 01/06/2017  . Fibromyalgia   . Pleuritic chest pain 01/06/2017  . Shortness of breath 01/06/2017   Assessment: 66 y/o F with CP and mildly elevated troponin. CBC good. Noted renal dysfunction. PTA meds reviewed.   Goal of Therapy:  Heparin level 0.3-0.7 units/ml Monitor platelets by anticoagulation protocol: Yes   Plan:  Heparin 2000 units BOLUS-reduced bolus with recent subcutaneous heparin Start heparin drip at 850 units/hr 1500 HL Daily CBC/HL Monitor for bleeding   Abran Duke 08/17/2017,6:46 AM

## 2017-08-17 NOTE — Plan of Care (Signed)
Oriented to room and unit. POC discussed. Verbalizes understanding.

## 2017-08-17 NOTE — Progress Notes (Signed)
Internal Medicine Attending  Date: 08/17/2017  Patient name: Mia Gonzales Medical record number: [id number] Date of birth: 24-Oct-1950 Age: 66 y.o. Gender: female  I saw and evaluated the patient. I reviewed the resident's note by Dr. Delma Officer and I agree with the resident's findings and plans as documented in her progress note.  Please see my H&P dated 08/17/2017 for the specifics of my evaluation, assessment, and plan from earlier in the day.

## 2017-08-17 NOTE — Consult Note (Signed)
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  Notes 3 weeks history of chest pain.  Sometimes radiates to the jaws.  It is positional and worsens with palpation.  She was admitted to the medicine residency service for this.  Initial troponin was normal.  Subsequently, rose to 0.03 and 0.08.  ECG sinus with non-diagnostic ST/T changes.  D dimer 0.51.  CRT 1.44.  WBC 16.5.  Overnight she had an episode of pain which was relieved by SL NTG.  S/P 324 ASA. On questioning; there may be 2 different pains, her fibromyalgia/costochondritis pain and an exertional chest pain as well relieved by rest.  This second version appears to be getting worse more recently. No edema.  She has healed up form her nephrectomy and is cancer free per her.  No SOB.    Previous Cardiac Studies: Normal ETT April 2018  Past Medical History:  Diagnosis Date  . Asthma 01/06/2017  . Essential hypertension 01/06/2017  . Fibromyalgia   . Pleuritic chest pain 01/06/2017  . Shortness of breath 01/06/2017    Past Surgical History:  Procedure Laterality Date  . CHOLECYSTECTOMY    . NEPHRECTOMY      Family History  Problem Relation Age of Onset  . Rheum arthritis Mother   . Cancer Mother   . Diabetes Father   . Cancer Father   . Heart disease Maternal Grandfather   . Heart attack Maternal Grandfather   . Heart disease Maternal Grandmother   . Diabetes Paternal Grandmother   . Heart attack Sister   . Cardiomyopathy Sister   . CAD Sister    Social History:  reports that she has quit smoking. she has never used smokeless tobacco. She reports that she does not drink alcohol or use drugs.  Allergies:  Allergies  Allergen Reactions  .  Tramadol-Acetaminophen     Other reaction(s): Vomiting  . Codeine Palpitations    No current facility-administered medications on file prior to encounter.    Current Outpatient Medications on File Prior to Encounter  Medication Sig Dispense Refill  . cefdinir (OMNICEF) 300 MG capsule Take 300 mg by mouth 2 (two) times daily. Started 08/11/17    . Cholecalciferol (VITAMIN D3) 10000 units TABS Take 5,000 Units by mouth daily.    . diclofenac (VOLTAREN) 75 MG EC tablet Take 75 mg by mouth 2 (two) times daily.     Marland Kitchen gabapentin (NEURONTIN) 300 MG capsule Take 300 mg by mouth 2 (two) times daily.     . Multiple Vitamins-Minerals (ICAPS AREDS 2 PO) Take 1 tablet by mouth 2 (two) times daily.    . predniSONE (DELTASONE) 10 MG tablet Take 10 mg by mouth. Started 08/15/17 4tab x 3days  3tab x 3days 2tab x 3days 1tab x 3days    . venlafaxine XR (EFFEXOR-XR) 75 MG 24 hr capsule Take 75 mg by mouth daily with breakfast.       Results for orders placed or performed during the hospital encounter of 08/16/17 (from the past 48 hour(s))  Basic metabolic panel     Status: Abnormal   Collection Time: 08/16/17  2:24 PM  Result Value Ref Range   Sodium 133 (L) 135 - 145 mmol/L   Potassium 4.3  3.5 - 5.1 mmol/L   Chloride 96 (L) 101 - 111 mmol/L   CO2 26 22 - 32 mmol/L   Glucose, Bld 339 (H) 65 - 99 mg/dL   BUN 25 (H) 6 - 20 mg/dL   Creatinine, Ser 1.44 (H) 0.44 - 1.00 mg/dL   Calcium 9.0 8.9 - 10.3 mg/dL   GFR calc non Af Amer 37 (L) >60 mL/min   GFR calc Af Amer 42 (L) >60 mL/min    Comment: (NOTE) The eGFR has been calculated using the CKD EPI equation. This calculation has not been validated in all clinical situations. eGFR's persistently <60 mL/min signify possible Chronic Kidney Disease.    Anion gap 11 5 - 15  CBC     Status: Abnormal   Collection Time: 08/16/17  2:24 PM  Result Value Ref Range   WBC 16.5 (H) 4.0 - 10.5 K/uL   RBC 5.22 (H) 3.87 - 5.11 MIL/uL   Hemoglobin 14.3 12.0 - 15.0  g/dL   HCT 43.6 36.0 - 46.0 %   MCV 83.5 78.0 - 100.0 fL   MCH 27.4 26.0 - 34.0 pg   MCHC 32.8 30.0 - 36.0 g/dL   RDW 14.5 11.5 - 15.5 %   Platelets 263 150 - 400 K/uL  I-stat troponin, ED     Status: None   Collection Time: 08/16/17  2:45 PM  Result Value Ref Range   Troponin i, poc 0.00 0.00 - 0.08 ng/mL   Comment 3            Comment: Due to the release kinetics of cTnI, a negative result within the first hours of the onset of symptoms does not rule out myocardial infarction with certainty. If myocardial infarction is still suspected, repeat the test at appropriate intervals.   D-dimer, quantitative (not at [facility])     Status: Abnormal   Collection Time: 08/16/17  4:35 PM  Result Value Ref Range   D-Dimer, Quant 0.51 (H) 0.00 - 0.50 ug/mL-FEU    Comment: (NOTE) At the manufacturer cut-off of 0.50 ug/mL FEU, this assay has been documented to exclude PE with a sensitivity and negative predictive value of 97 to 99%.  At this time, this assay has not been approved by the FDA to exclude DVT/VTE. Results should be correlated with clinical presentation.   Troponin I (q 6hr x 3)     Status: Abnormal   Collection Time: 08/16/17  6:45 PM  Result Value Ref Range   Troponin I 0.03 (HH) <0.03 ng/mL    Comment: CRITICAL RESULT CALLED TO, READ BACK BY AND VERIFIED WITH: J.TSOUTIS,RN 1947 11.24.18 SPIKESN    Glucose, capillary     Status: Abnormal   Collection Time: 08/16/17  9:52 PM  Result Value Ref Range   Glucose-Capillary 225 (H) 65 - 99 mg/dL  Troponin I (q 6hr x 3)     Status: Abnormal   Collection Time: 08/17/17 12:15 AM  Result Value Ref Range   Troponin I 0.08 (HH) <0.03 ng/mL    Comment: CRITICAL VALUE NOTED.  VALUE IS CONSISTENT WITH PREVIOUSLY REPORTED AND CALLED VALUE.  Lipid panel     Status: Abnormal   Collection Time: 08/17/17 12:15 AM  Result Value Ref Range   Cholesterol 205 (H) 0 - 200 mg/dL   Triglycerides 267 (H) <150 mg/dL   HDL 31 (L) >40 mg/dL   Total  CHOL/HDL Ratio 6.6 RATIO   VLDL 53 (H) 0 - 40 mg/dL   LDL Cholesterol 121 (H)  0 - 99 mg/dL    Comment:        Total Cholesterol/HDL:CHD Risk Coronary Heart Disease Risk Table                     Men   Women  1/2 Average Risk   3.4   3.3  Average Risk       5.0   4.4  2 X Average Risk   9.6   7.1  3 X Average Risk  23.4   11.0        Use the calculated Patient Ratio above and the CHD Risk Table to determine the patient's CHD Risk.        ATP III CLASSIFICATION (LDL):  <100     mg/dL   Optimal  100-129  mg/dL   Near or Above                    Optimal  130-159  mg/dL   Borderline  160-189  mg/dL   High  >190     mg/dL   Very High    Dg Chest 2 View  Result Date: 08/16/2017 CLINICAL DATA:  66 year old female with chest pain, midsternal radiating to left arm and neck. EXAM: CHEST  2 VIEW COMPARISON:  None. FINDINGS: The heart size and mediastinal contours are within normal limits. Both lungs are clear. The visualized skeletal structures are unremarkable. IMPRESSION: No active cardiopulmonary disease. Electronically Signed   By: Kristopher Oppenheim M.D.   On: 08/16/2017 15:23    ECG/TELE: NSR with non specific ST/T changes  ROS: As above. Otherwise, review of systems is negative unless per above HPI  Vitals:   08/16/17 2004 08/16/17 2134 08/16/17 2150 08/17/17 0552  BP: (!) 162/75 (!) 174/91 (!) 146/69 129/74  Pulse: 78 95 86 66  Resp: 18   20  Temp: 98.1 F (36.7 C)   98 F (36.7 C)  TempSrc: Oral   Oral  SpO2: 100%   95%  Weight:    98.2 kg (216 lb 9.6 oz)  Height:       Wt Readings from Last 10 Encounters:  08/17/17 98.2 kg (216 lb 9.6 oz)  01/02/17 98.1 kg (216 lb 3.2 oz)    PE:  General: No acute distress HEENT: Atraumatic, EOMI, mucous membranes moist. No JVD at 45 degrees. No HJR. CV: RRR no murmurs, gallops.  Respiratory: Clear, no crackles. Normal work of breathing ABD: Non-distended and non-tender. No palpable organomegaly.  Extremities: 2+ radial pulses  bilaterally. 1+ BL LE edema. Neuro/Psych: CN grossly intact, alert and oriented  Assessment/Plan NSTEMI Other Chronic Chest Pain  HTN CKD s/p nephrectomy Elevated WBC  On questioning; there may be 2 different pains, her fibromyalgia/costochondritis pain and an exertional chest pain as well relieved by rest.  This second version appears to be getting worse more recently. Episode of pain at rest overnight relieved by SL NTG.  Case complicated by CKD s/p nephrectomy.  Echocardiogram to help look for WMA/RV function (D dimer elevated, history of RCC). - Heparin ACS, TTE (ordered) - Continue ASA 81 mg. - Monitor renal function, symptoms and discuss risks/benefits of contrast exposure for angiography pending other studies/data  Lolita Cram Shawny Borkowski  MD 08/17/2017, 6:19 AM

## 2017-08-17 NOTE — Progress Notes (Signed)
Troponin = 0.17. Azalee Course, PA notified.

## 2017-08-18 ENCOUNTER — Observation Stay (HOSPITAL_COMMUNITY): Payer: Medicare HMO

## 2017-08-18 ENCOUNTER — Inpatient Hospital Stay (HOSPITAL_COMMUNITY)
Admission: EM | Disposition: A | Discharge status: Home / Self Care | Payer: Self-pay | Source: Home / Self Care | Attending: Internal Medicine

## 2017-08-18 DIAGNOSIS — Z807 Family history of other malignant neoplasms of lymphoid, hematopoietic and related tissues: Secondary | ICD-10-CM | POA: Diagnosis not present

## 2017-08-18 DIAGNOSIS — I10 Essential (primary) hypertension: Secondary | ICD-10-CM | POA: Diagnosis not present

## 2017-08-18 DIAGNOSIS — Z833 Family history of diabetes mellitus: Secondary | ICD-10-CM | POA: Diagnosis not present

## 2017-08-18 DIAGNOSIS — R079 Chest pain, unspecified: Secondary | ICD-10-CM | POA: Diagnosis present

## 2017-08-18 DIAGNOSIS — I428 Other cardiomyopathies: Secondary | ICD-10-CM

## 2017-08-18 DIAGNOSIS — I200 Unstable angina: Secondary | ICD-10-CM | POA: Diagnosis not present

## 2017-08-18 DIAGNOSIS — R402252 Coma scale, best verbal response, oriented, at arrival to emergency department: Secondary | ICD-10-CM | POA: Diagnosis present

## 2017-08-18 DIAGNOSIS — R402362 Coma scale, best motor response, obeys commands, at arrival to emergency department: Secondary | ICD-10-CM | POA: Diagnosis present

## 2017-08-18 DIAGNOSIS — Z87891 Personal history of nicotine dependence: Secondary | ICD-10-CM | POA: Diagnosis not present

## 2017-08-18 DIAGNOSIS — N179 Acute kidney failure, unspecified: Secondary | ICD-10-CM | POA: Diagnosis present

## 2017-08-18 DIAGNOSIS — Z905 Acquired absence of kidney: Secondary | ICD-10-CM | POA: Diagnosis not present

## 2017-08-18 DIAGNOSIS — M797 Fibromyalgia: Secondary | ICD-10-CM | POA: Diagnosis present

## 2017-08-18 DIAGNOSIS — Z8261 Family history of arthritis: Secondary | ICD-10-CM | POA: Diagnosis not present

## 2017-08-18 DIAGNOSIS — Z79899 Other long term (current) drug therapy: Secondary | ICD-10-CM

## 2017-08-18 DIAGNOSIS — Z9049 Acquired absence of other specified parts of digestive tract: Secondary | ICD-10-CM | POA: Diagnosis not present

## 2017-08-18 DIAGNOSIS — I358 Other nonrheumatic aortic valve disorders: Secondary | ICD-10-CM | POA: Diagnosis present

## 2017-08-18 DIAGNOSIS — E7800 Pure hypercholesterolemia, unspecified: Secondary | ICD-10-CM

## 2017-08-18 DIAGNOSIS — Z91041 Radiographic dye allergy status: Secondary | ICD-10-CM | POA: Diagnosis not present

## 2017-08-18 DIAGNOSIS — I214 Non-ST elevation (NSTEMI) myocardial infarction: Secondary | ICD-10-CM | POA: Diagnosis present

## 2017-08-18 DIAGNOSIS — Z885 Allergy status to narcotic agent status: Secondary | ICD-10-CM | POA: Diagnosis not present

## 2017-08-18 DIAGNOSIS — Z955 Presence of coronary angioplasty implant and graft: Secondary | ICD-10-CM

## 2017-08-18 DIAGNOSIS — E119 Type 2 diabetes mellitus without complications: Secondary | ICD-10-CM | POA: Diagnosis not present

## 2017-08-18 DIAGNOSIS — J019 Acute sinusitis, unspecified: Secondary | ICD-10-CM | POA: Diagnosis present

## 2017-08-18 DIAGNOSIS — I129 Hypertensive chronic kidney disease with stage 1 through stage 4 chronic kidney disease, or unspecified chronic kidney disease: Secondary | ICD-10-CM | POA: Diagnosis present

## 2017-08-18 DIAGNOSIS — J45909 Unspecified asthma, uncomplicated: Secondary | ICD-10-CM | POA: Diagnosis present

## 2017-08-18 DIAGNOSIS — I25110 Atherosclerotic heart disease of native coronary artery with unstable angina pectoris: Secondary | ICD-10-CM | POA: Diagnosis present

## 2017-08-18 DIAGNOSIS — Z8249 Family history of ischemic heart disease and other diseases of the circulatory system: Secondary | ICD-10-CM | POA: Diagnosis not present

## 2017-08-18 DIAGNOSIS — N182 Chronic kidney disease, stage 2 (mild): Secondary | ICD-10-CM | POA: Diagnosis present

## 2017-08-18 DIAGNOSIS — Z85528 Personal history of other malignant neoplasm of kidney: Secondary | ICD-10-CM | POA: Diagnosis not present

## 2017-08-18 DIAGNOSIS — E1122 Type 2 diabetes mellitus with diabetic chronic kidney disease: Secondary | ICD-10-CM | POA: Diagnosis present

## 2017-08-18 DIAGNOSIS — E785 Hyperlipidemia, unspecified: Secondary | ICD-10-CM | POA: Diagnosis present

## 2017-08-18 DIAGNOSIS — R402142 Coma scale, eyes open, spontaneous, at arrival to emergency department: Secondary | ICD-10-CM | POA: Diagnosis present

## 2017-08-18 HISTORY — PX: CARDIAC CATHETERIZATION: SHX172

## 2017-08-18 HISTORY — PX: LEFT HEART CATH AND CORONARY ANGIOGRAPHY: CATH118249

## 2017-08-18 HISTORY — PX: CORONARY STENT INTERVENTION: CATH118234

## 2017-08-18 LAB — BASIC METABOLIC PANEL
ANION GAP: 11 (ref 5–15)
BUN: 31 mg/dL — ABNORMAL HIGH (ref 6–20)
CALCIUM: 8.5 mg/dL — AB (ref 8.9–10.3)
CO2: 23 mmol/L (ref 22–32)
Chloride: 104 mmol/L (ref 101–111)
Creatinine, Ser: 1.4 mg/dL — ABNORMAL HIGH (ref 0.44–1.00)
GFR calc non Af Amer: 38 mL/min — ABNORMAL LOW (ref >60)
GFR, EST AFRICAN AMERICAN: 44 mL/min — AB (ref >60)
Glucose, Bld: 129 mg/dL — ABNORMAL HIGH (ref 65–99)
POTASSIUM: 4.2 mmol/L (ref 3.5–5.1)
SODIUM: 138 mmol/L (ref 135–145)

## 2017-08-18 LAB — POCT ACTIVATED CLOTTING TIME: Activated Clotting Time: 384 seconds

## 2017-08-18 LAB — TROPONIN I: TROPONIN I: 0.12 ng/mL — AB (ref <0.03)

## 2017-08-18 LAB — GLUCOSE, CAPILLARY
GLUCOSE-CAPILLARY: 135 mg/dL — AB (ref 65–99)
GLUCOSE-CAPILLARY: 115 mg/dL — AB (ref 65–99)
GLUCOSE-CAPILLARY: 170 mg/dL — AB (ref 65–99)
Glucose-Capillary: 103 mg/dL — ABNORMAL HIGH (ref 65–99)

## 2017-08-18 LAB — CBC
HEMATOCRIT: 42.6 % (ref 36.0–46.0)
HEMOGLOBIN: 13.7 g/dL (ref 12.0–15.0)
MCH: 27.4 pg (ref 26.0–34.0)
MCHC: 32.2 g/dL (ref 30.0–36.0)
MCV: 85.2 fL (ref 78.0–100.0)
Platelets: UNDETERMINED 10*3/uL (ref 150–400)
RBC: 5 MIL/uL (ref 3.87–5.11)
RDW: 15.4 % (ref 11.5–15.5)
WBC: 12.6 10*3/uL — AB (ref 4.0–10.5)

## 2017-08-18 LAB — HEMOGLOBIN A1C
Hgb A1c MFr Bld: 7.6 % — ABNORMAL HIGH (ref 4.8–5.6)
Mean Plasma Glucose: 171 mg/dL

## 2017-08-18 LAB — HEPARIN LEVEL (UNFRACTIONATED)
HEPARIN UNFRACTIONATED: 0.25 [IU]/mL — AB (ref 0.30–0.70)
HEPARIN UNFRACTIONATED: 0.4 [IU]/mL (ref 0.30–0.70)

## 2017-08-18 LAB — PROTIME-INR
INR: 1.02
PROTHROMBIN TIME: 13.3 s (ref 11.4–15.2)

## 2017-08-18 SURGERY — LEFT HEART CATH AND CORONARY ANGIOGRAPHY
Anesthesia: LOCAL

## 2017-08-18 MED ORDER — SODIUM CHLORIDE 0.9% FLUSH: 3.0000 mL | Freq: Two times a day (BID) | INTRAVENOUS | Status: DC

## 2017-08-18 MED ORDER — FENTANYL CITRATE (PF) 100 MCG/2ML IJ SOLN
INTRAMUSCULAR | Status: DC | PRN
  Administered 2017-08-18: 50 ug via INTRAVENOUS
  Administered 2017-08-18 (×4): 25 ug via INTRAVENOUS

## 2017-08-18 MED ORDER — NITROGLYCERIN 1 MG/10 ML FOR IR/CATH LAB
INTRA_ARTERIAL | Status: DC | PRN
  Administered 2017-08-18: 200 ug via INTRA_ARTERIAL
  Administered 2017-08-18: 400 ug via INTRA_ARTERIAL

## 2017-08-18 MED ORDER — HYDRALAZINE HCL 20 MG/ML IJ SOLN: 5.0000 mg | INTRAMUSCULAR | Status: AC | PRN

## 2017-08-18 MED ORDER — SODIUM CHLORIDE 0.9 % IV SOLN: 250.0000 mL | INTRAVENOUS | Status: DC | PRN

## 2017-08-18 MED ORDER — HEART ATTACK BOUNCING BOOK
Freq: Once | Status: AC
  Administered 2017-08-19: 1
  Filled 2017-08-18 (×2): qty 1

## 2017-08-18 MED ORDER — SODIUM CHLORIDE 0.9% FLUSH: 3.0000 mL | INTRAVENOUS | Status: DC | PRN

## 2017-08-18 MED ORDER — HEPARIN SODIUM (PORCINE) 1000 UNIT/ML IJ SOLN
INTRAMUSCULAR | Status: DC | PRN
  Administered 2017-08-18 (×2): 5000 [IU] via INTRAVENOUS

## 2017-08-18 MED ORDER — TICAGRELOR 90 MG PO TABS
90.0000 mg | ORAL_TABLET | Freq: Two times a day (BID) | ORAL | Status: DC
  Administered 2017-08-19 (×2): 90 mg via ORAL
  Filled 2017-08-18 (×2): qty 1

## 2017-08-18 MED ORDER — LIDOCAINE HCL (PF) 1 % IJ SOLN
INTRAMUSCULAR | Status: DC | PRN
  Administered 2017-08-18: 10 mL

## 2017-08-18 MED ORDER — TIROFIBAN HCL IN NACL 5-0.9 MG/100ML-% IV SOLN
INTRAVENOUS | Status: DC | PRN
  Administered 2017-08-18: 0.15 ug/kg/min via INTRAVENOUS

## 2017-08-18 MED ORDER — LIDOCAINE HCL (PF) 1 % IJ SOLN
INTRAMUSCULAR | Status: AC
  Filled 2017-08-18: qty 30

## 2017-08-18 MED ORDER — SODIUM CHLORIDE 0.9 % WEIGHT BASED INFUSION
3.0000 mL/kg/h | INTRAVENOUS | Status: DC
  Administered 2017-08-18: 3 mL/kg/h via INTRAVENOUS

## 2017-08-18 MED ORDER — HEPARIN SODIUM (PORCINE) 1000 UNIT/ML IJ SOLN
INTRAMUSCULAR | Status: AC
  Filled 2017-08-18: qty 1

## 2017-08-18 MED ORDER — ACETAMINOPHEN 325 MG PO TABS
650.0000 mg | ORAL_TABLET | ORAL | Status: DC | PRN
  Administered 2017-08-18: 650 mg via ORAL
  Filled 2017-08-18: qty 2

## 2017-08-18 MED ORDER — VERAPAMIL HCL 2.5 MG/ML IV SOLN
INTRAVENOUS | Status: AC
  Filled 2017-08-18: qty 2

## 2017-08-18 MED ORDER — ONDANSETRON HCL 4 MG/2ML IJ SOLN: 4.0000 mg | Freq: Four times a day (QID) | INTRAMUSCULAR | Status: DC | PRN

## 2017-08-18 MED ORDER — ANGIOPLASTY BOOK
Freq: Once | Status: AC
  Administered 2017-08-18: 1
  Filled 2017-08-18: qty 1

## 2017-08-18 MED ORDER — FENTANYL CITRATE (PF) 100 MCG/2ML IJ SOLN
INTRAMUSCULAR | Status: AC
  Filled 2017-08-18: qty 2

## 2017-08-18 MED ORDER — SODIUM CHLORIDE 0.9 % WEIGHT BASED INFUSION: 1.0000 mL/kg/h | INTRAVENOUS | Status: DC

## 2017-08-18 MED ORDER — IOPAMIDOL (ISOVUE-370) INJECTION 76%
INTRAVENOUS | Status: AC
  Filled 2017-08-18: qty 100

## 2017-08-18 MED ORDER — SODIUM CHLORIDE 0.9 % IV SOLN
INTRAVENOUS | Status: AC
  Administered 2017-08-18: 16:00:00 via INTRAVENOUS

## 2017-08-18 MED ORDER — ASPIRIN 81 MG PO CHEW
81.0000 mg | CHEWABLE_TABLET | ORAL | Status: AC
  Administered 2017-08-18: 81 mg via ORAL
  Filled 2017-08-18: qty 1

## 2017-08-18 MED ORDER — MIDAZOLAM HCL 2 MG/2ML IJ SOLN
INTRAMUSCULAR | Status: DC | PRN
  Administered 2017-08-18: 1 mg via INTRAVENOUS
  Administered 2017-08-18: 2 mg via INTRAVENOUS
  Administered 2017-08-18 (×2): 1 mg via INTRAVENOUS

## 2017-08-18 MED ORDER — IOPAMIDOL (ISOVUE-370) INJECTION 76%
INTRAVENOUS | Status: DC | PRN
  Administered 2017-08-18: 80 mL via INTRA_ARTERIAL

## 2017-08-18 MED ORDER — MIDAZOLAM HCL 2 MG/2ML IJ SOLN
INTRAMUSCULAR | Status: AC
  Filled 2017-08-18: qty 2

## 2017-08-18 MED ORDER — TIROFIBAN HCL IN NACL 5-0.9 MG/100ML-% IV SOLN
INTRAVENOUS | Status: AC
  Filled 2017-08-18: qty 100

## 2017-08-18 MED ORDER — ASPIRIN 81 MG PO CHEW
81.0000 mg | CHEWABLE_TABLET | Freq: Every day | ORAL | Status: DC
Start: 2017-08-18 — End: 2017-08-18

## 2017-08-18 MED ORDER — TICAGRELOR 90 MG PO TABS
ORAL_TABLET | ORAL | Status: AC
  Filled 2017-08-18: qty 2

## 2017-08-18 MED ORDER — LABETALOL HCL 5 MG/ML IV SOLN: 10.0000 mg | INTRAVENOUS | Status: AC | PRN

## 2017-08-18 MED ORDER — VENLAFAXINE HCL ER 75 MG PO CP24
75.0000 mg | ORAL_CAPSULE | Freq: Two times a day (BID) | ORAL | Status: DC
  Administered 2017-08-18 – 2017-08-19 (×2): 75 mg via ORAL
  Filled 2017-08-18 (×2): qty 1

## 2017-08-18 MED ORDER — HEPARIN (PORCINE) IN NACL 2-0.9 UNIT/ML-% IJ SOLN
INTRAMUSCULAR | Status: AC
  Filled 2017-08-18: qty 1000

## 2017-08-18 MED ORDER — NITROGLYCERIN 1 MG/10 ML FOR IR/CATH LAB
INTRA_ARTERIAL | Status: AC
  Filled 2017-08-18: qty 10

## 2017-08-18 MED ORDER — HEPARIN (PORCINE) IN NACL 2-0.9 UNIT/ML-% IJ SOLN
INTRAMUSCULAR | Status: DC | PRN
  Administered 2017-08-18: 10 mL via INTRA_ARTERIAL

## 2017-08-18 MED ORDER — HEPARIN SODIUM (PORCINE) 5000 UNIT/ML IJ SOLN
5000.0000 [IU] | Freq: Three times a day (TID) | INTRAMUSCULAR | Status: DC
  Administered 2017-08-19: 5000 [IU] via SUBCUTANEOUS
  Filled 2017-08-18: qty 1

## 2017-08-18 MED ORDER — TIROFIBAN (AGGRASTAT) BOLUS VIA INFUSION
INTRAVENOUS | Status: DC | PRN
  Administered 2017-08-18: 2442.5 ug via INTRAVENOUS

## 2017-08-18 MED ORDER — SODIUM CHLORIDE 0.9 % IV SOLN: INTRAVENOUS | Status: AC

## 2017-08-18 MED ORDER — TICAGRELOR 90 MG PO TABS
ORAL_TABLET | ORAL | Status: DC | PRN
  Administered 2017-08-18: 180 mg via ORAL

## 2017-08-18 SURGICAL SUPPLY — 20 items
BALLN SAPPHIRE 2.5X15 (BALLOONS) ×2
BALLN ~~LOC~~ EUPHORA RX 3.5X20 (BALLOONS) ×2
BALLOON SAPPHIRE 2.5X15 (BALLOONS) ×1 IMPLANT
BALLOON ~~LOC~~ EUPHORA RX 3.5X20 (BALLOONS) ×1 IMPLANT
CATH 5FR JL3.5 JR4 ANG PIG MP (CATHETERS) ×2 IMPLANT
CATH LAUNCHER 6FR JR4 (CATHETERS) ×2 IMPLANT
COVER PRB 48X5XTLSCP FOLD TPE (BAG) ×1 IMPLANT
COVER PROBE 5X48 (BAG) ×1
DEVICE RAD COMP TR BAND LRG (VASCULAR PRODUCTS) ×2 IMPLANT
GLIDESHEATH SLEND SS 6F .021 (SHEATH) ×2 IMPLANT
GUIDEWIRE INQWIRE 1.5J.035X260 (WIRE) ×1 IMPLANT
INQWIRE 1.5J .035X260CM (WIRE) ×2
KIT ENCORE 26 ADVANTAGE (KITS) ×2 IMPLANT
KIT HEART LEFT (KITS) ×2 IMPLANT
PACK CARDIAC CATHETERIZATION (CUSTOM PROCEDURE TRAY) ×2 IMPLANT
STENT SIERRA 3.00 X 38 MM (Permanent Stent) ×2 IMPLANT
TRANSDUCER W/STOPCOCK (MISCELLANEOUS) ×2 IMPLANT
TUBING CIL FLEX 10 FLL-RA (TUBING) ×2 IMPLANT
VALVE GUARDIAN II ~~LOC~~ HEMO (MISCELLANEOUS) ×2 IMPLANT
WIRE ASAHI PROWATER 180CM (WIRE) ×2 IMPLANT

## 2017-08-18 NOTE — Plan of Care (Signed)
Pt c/o CP and dyspnea on exertion when ambulating to bathroom. Pt states this is a recurring problem. Planned ST 11/26. Trop (+) but trending down.

## 2017-08-18 NOTE — Progress Notes (Signed)
Pt awoke with 8/10 CP. 1 SL nitro given. Cardiologist paged. Verbal order to obtain EKG and not send pt to stress test until seen by Cardiologist, pt possibly needs cath. Pain now 0/10, BP 121/68. Information has been passed off to oncoming RN to continue pt care.  Viviano Simas, RN

## 2017-08-18 NOTE — H&P (View-Only) (Signed)
 Progress Note  Patient Name: Mia Gonzales Date of Encounter: 08/18/2017  Primary Cardiologist: [place]  Subjective   Recurrent chest discomfort this morning, retrosternal radiating towards her left shoulder and down left arm, relieved roughly 2 minutes after sublingual nitroglycerin was administered.   Discomfort this morning woke her from sleep, but is similar to the exertion-induced discomfort she has had for the last couple of weeks Troponin has hovered around 0.14, after being normal on admission.  ECG shows persistent mild T wave inversion in leads I and aVL, with normalization of the inverted T waves in leads V1-V2.   Inpatient Medications    Scheduled Meds: . [START ON 08/19/2017] aspirin  81 mg Oral Pre-Cath  . aspirin EC  81 mg Oral Daily  . atorvastatin  40 mg Oral q1800  . gabapentin  300 mg Oral BID  . insulin aspart  0-9 Units Subcutaneous TID WC  . metoprolol tartrate  25 mg Oral BID  . pantoprazole  40 mg Oral Daily  . sodium chloride flush  3 mL Intravenous Q12H  . sodium chloride flush  3 mL Intravenous Q12H  . venlafaxine XR  75 mg Oral Q breakfast   Continuous Infusions: . sodium chloride    . [START ON 08/19/2017] sodium chloride     Followed by  . [START ON 08/19/2017] sodium chloride    . heparin 1,000 Units/hr (08/18/17 0453)   PRN Meds: sodium chloride, acetaminophen **OR** acetaminophen, diclofenac sodium, nitroGLYCERIN, promethazine, sodium chloride flush   Vital Signs    Vitals:   08/17/17 2258 08/18/17 0447 08/18/17 0659 08/18/17 0709  BP: (!) 105/56 123/77 (!) 143/92 121/68  Pulse:  (!) 58    Resp:  18    Temp:  (!) 97.4 F (36.3 C)    TempSrc:  Oral    SpO2:  97%    Weight:  215 lb 4.8 oz (97.7 kg)    Height:        Intake/Output Summary (Last 24 hours) at 08/18/2017 0817 Last data filed at 08/18/2017 0717 Gross per 24 hour  Intake 881.64 ml  Output 1802 ml  Net -920.36 ml   Filed Weights   08/16/17 2002 08/17/17 0552  08/18/17 0447  Weight: 215 lb 3.2 oz (97.6 kg) 216 lb 9.6 oz (98.2 kg) 215 lb 4.8 oz (97.7 kg)    Telemetry    Normal sinus rhythm- Personally Reviewed  ECG    Sinus rhythm with atrial bigeminy, fairly subtle T wave inversion in leads I, aVL, resolved T wave inversion in leads V1-V2- Personally Reviewed  Physical Exam  Overweight/mildly obese GEN: No acute distress.   Neck: No JVD Cardiac: RRR, no murmurs, rubs, or gallops.  Respiratory: Clear to auscultation bilaterally. GI: Soft, nontender, non-distended  MS: No edema; No deformity. Neuro:  Nonfocal  Psych: Normal affect   Labs    Chemistry Recent Labs  Lab 08/16/17 1424 08/17/17 0749  NA 133* 137  K 4.3 3.7  CL 96* 103  CO2 26 26  GLUCOSE 339* 127*  BUN 25* 28*  CREATININE 1.44* 1.18*  CALCIUM 9.0 8.4*  PROT  --  6.3*  ALBUMIN  --  3.3*  AST  --  18  ALT  --  18  ALKPHOS  --  85  BILITOT  --  0.5  GFRNONAA 37* 47*  GFRAA 42* 54*  ANIONGAP 11 8     Hematology Recent Labs  Lab 08/16/17 1424 08/17/17 0749 08/17/17 1849  WBC 16.5* 14.7*   15.7*  RBC 5.22* 4.72 4.75  HGB 14.3 12.6 12.8  HCT 43.6 40.0 40.0  MCV 83.5 84.7 84.2  MCH 27.4 26.7 26.9  MCHC 32.8 31.5 32.0  RDW 14.5 15.0 14.8  PLT 263 234 223    Cardiac Enzymes Recent Labs  Lab 08/17/17 0749 08/17/17 1244 08/17/17 1849 08/17/17 2302  TROPONINI 0.17* 0.14* 0.13* 0.13*    Recent Labs  Lab 08/16/17 1445  TROPIPOC 0.00     BNPNo results for input(s): BNP, PROBNP in the last 168 hours.   DDimer  Recent Labs  Lab 08/16/17 1635  DDIMER 0.51*     Radiology    Dg Chest 2 View  Result Date: 08/16/2017 CLINICAL DATA:  66 year old female with chest pain, midsternal radiating to left arm and neck. EXAM: CHEST  2 VIEW COMPARISON:  None. FINDINGS: The heart size and mediastinal contours are within normal limits. Both lungs are clear. The visualized skeletal structures are unremarkable. IMPRESSION: No active cardiopulmonary disease.  Electronically Signed   By: Sande Brothers M.D.   On: 08/16/2017 15:23    Cardiac Studies   Echo pending  Patient Profile     66 y.o. female with family history of coronary artery disease, roughly 4 months after radical nephrectomy for right renal cell carcinoma, hypertension, presents with accelerating exertional angina now with angina at rest relieved by sublingual nitroglycerin, mildly elevated troponin and subtle ECG changes.  Assessment & Plan    [place]: High suspicion for unstable angina/small non-ST segment elevation myocardial infarction.  We will cancel plans for stress test and schedule for left heart catheterization and possible angioplasty-stent.This procedure has been fully reviewed with the patient and written informed consent has been obtained.   CKD 2: Start intravenous fluids.  Note history of postsurgical single kidney, but creatinine has improved substantially from admission and is only slightly above normal. HTN: Well-controlled. HLP:  For questions or updates, please contact [facility] Please consult [email or web address] for contact info under Cardiology/STEMI.      Signed, Thurmon Fair, MD  08/18/2017, 8:17 AM

## 2017-08-18 NOTE — Progress Notes (Addendum)
Due to the patient's repeated chest pains and his risk factors decision was made for Mia Gonzales to have a left heart catheterization done. 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 There was also 25% stenosis in the 1st marginal artery, 25% stenosis in the mid cx, and 20% stenosis in the mid LAD.   -continue tirofiban 2 hrs post procedure -continue dapt for 1 yr -will monitor tonight and discharge tomorrow  Lorenso Courier, MD Internal Medicine PGY1 Pager:[phone] 08/18/2017, 5:04 PM

## 2017-08-18 NOTE — Progress Notes (Signed)
ANTICOAGULATION CONSULT NOTE - Follow-up  Pharmacy Consult for Heparin  Indication: chest pain/ACS  Allergies  Allergen Reactions  . Tramadol-Acetaminophen     Other reaction(s): Vomiting  . Codeine Palpitations   Patient Measurements: Height: 5\' 7"  (170.2 cm) Weight: 215 lb 4.8 oz (97.7 kg) IBW/kg (Calculated) : 61.6  Vital Signs: Temp: 97.4 F (36.3 C) (11/26 0447) Temp Source: Oral (11/26 0447) BP: 126/63 (11/26 0945) Pulse Rate: 58 (11/26 0447)  Labs: Recent Labs    08/16/17 1424  08/17/17 0749 08/17/17 1244 08/17/17 1849 08/17/17 2302 08/18/17 0357 08/18/17 0747 08/18/17 0915 08/18/17 1240  HGB 14.3  --  12.6  --  12.8  --   --  13.7  --   --   HCT 43.6  --  40.0  --  40.0  --   --  42.6  --   --   PLT 263  --  234  --  223  --   --  PLATELET CLUMPS NOTED ON SMEAR, UNABLE TO ESTIMATE  --   --   LABPROT  --   --   --   --   --   --   --   --  13.3  --   INR  --   --   --   --   --   --   --   --  1.02  --   HEPARINUNFRC  --   --   --  0.38  --   --  0.25*  --   --  0.40  CREATININE 1.44*  --  1.18*  --   --   --   --  1.40*  --   --   TROPONINI  --    < > 0.17* 0.14* 0.13* 0.13*  --  0.12*  --   --    < > = values in this interval not displayed.   Estimated Creatinine Clearance: 46.8 mL/min (A) (by C-G formula based on SCr of 1.4 mg/dL (H)).  Medical History: Past Medical History:  Diagnosis Date  . Asthma 01/06/2017  . Essential hypertension 01/06/2017  . Fibromyalgia   . Pleuritic chest pain 01/06/2017  . Shortness of breath 01/06/2017   Assessment: 66 y/o F with CP and mildly elevated troponin, likely unstable angina/NSTEMI. Noted renal dysfunction (has one kidney). Not on PTA anticoagulation.  Repeat level this afternoon came back within goal range at 0.4, on 1000 units/hr. CBC remains stable. No s/sx of bleeding noted. No issues with heparin infusion.   Goal of Therapy:  Heparin level 0.3-0.7 units/ml Monitor platelets by anticoagulation protocol:  Yes   Plan:  Continue heparin gtt at 1000 units/hr Daily CBC/HL Monitor for bleeding Follow-up after cath for anticoagulation plans  Girard Cooter, PharmD Clinical Pharmacist  Pager: [phone] Clinical Phone for 08/18/2017 until 3:30pm: [phone] If after 3:30pm, please call main pharmacy at [phone]

## 2017-08-18 NOTE — Progress Notes (Signed)
TR BAND REMOVAL  LOCATION:    right radial  DEFLATED PER PROTOCOL:    Yes.    TIME BAND OFF / DRESSING APPLIED:    19:30   SITE UPON ARRIVAL:    Level 2   SITE AFTER BAND REMOVAL:    Level 2  CIRCULATION SENSATION AND MOVEMENT:    Within Normal Limits   Yes.    COMMENTS:   Post TR band instructions given. Hematoma marked. Pressure dressing with coban applied & ice pack applied.Pt tolerated well.

## 2017-08-18 NOTE — Interval H&P Note (Signed)
Cath Lab Visit (complete for each Cath Lab visit)  Clinical Evaluation Leading to the Procedure:   ACS: Yes.    Non-ACS:    Anginal Classification: CCS IV  Anti-ischemic medical therapy: Minimal Therapy (1 class of medications)  Non-Invasive Test Results: No non-invasive testing performed  Prior CABG: No previous CABG      History and Physical Interval Note:  08/18/2017 2:15 PM  Mia Gonzales  has presented today for surgery, with the diagnosis of cp  The various methods of treatment have been discussed with the patient and family. After consideration of risks, benefits and other options for treatment, the patient has consented to  Procedure(s): LEFT HEART CATH AND CORONARY ANGIOGRAPHY (N/A) as a surgical intervention .  The patient's history has been reviewed, patient examined, no change in status, stable for surgery.  I have reviewed the patient's chart and labs.  Questions were answered to the patient's satisfaction.     Lance Muss

## 2017-08-18 NOTE — Progress Notes (Signed)
ANTICOAGULATION CONSULT NOTE - Follow Up Consult  Pharmacy Consult for heparin Indication: mild NSTEMI  Labs: Recent Labs    08/16/17 1424  08/17/17 0749 08/17/17 1244 08/17/17 1849 08/17/17 2302 08/18/17 0357  HGB 14.3  --  12.6  --  12.8  --   --   HCT 43.6  --  40.0  --  40.0  --   --   PLT 263  --  234  --  223  --   --   HEPARINUNFRC  --   --   --  0.38  --   --  0.25*  CREATININE 1.44*  --  1.18*  --   --   --   --   TROPONINI  --    < > 0.17* 0.14* 0.13* 0.13*  --    < > = values in this interval not displayed.    Assessment: 66yo female now subtherapeutic on heparin after one level at goal.  Goal of Therapy:  Heparin level 0.3-0.7 units/ml   Plan:  Will increase heparin gtt by 1-2 units/kg/hr to 1000 units/hr and check level in 8hr.  Vernard Gambles, PharmD, BCPS  08/18/2017,4:49 AM

## 2017-08-18 NOTE — Progress Notes (Signed)
Progress Note  Patient Name: Mia FruitLynn Gonzales Date of Encounter: 08/18/2017  Primary Cardiologist: Duke Salviaandolph  Subjective   Recurrent chest discomfort this morning, retrosternal radiating towards her left shoulder and down left arm, relieved roughly 2 minutes after sublingual nitroglycerin was administered.   Discomfort this morning woke her from sleep, but is similar to the exertion-induced discomfort she has had for the last couple of weeks Troponin has hovered around 0.14, after being normal on admission.  ECG shows persistent mild T wave inversion in leads I and aVL, with normalization of the inverted T waves in leads V1-V2.   Inpatient Medications    Scheduled Meds: . [START ON 08/19/2017] aspirin  81 mg Oral Pre-Cath  . aspirin EC  81 mg Oral Daily  . atorvastatin  40 mg Oral q1800  . gabapentin  300 mg Oral BID  . insulin aspart  0-9 Units Subcutaneous TID WC  . metoprolol tartrate  25 mg Oral BID  . pantoprazole  40 mg Oral Daily  . sodium chloride flush  3 mL Intravenous Q12H  . sodium chloride flush  3 mL Intravenous Q12H  . venlafaxine XR  75 mg Oral Q breakfast   Continuous Infusions: . sodium chloride    . [START ON 08/19/2017] sodium chloride     Followed by  . [START ON 08/19/2017] sodium chloride    . heparin 1,000 Units/hr (08/18/17 0453)   PRN Meds: sodium chloride, acetaminophen **OR** acetaminophen, diclofenac sodium, nitroGLYCERIN, promethazine, sodium chloride flush   Vital Signs    Vitals:   08/17/17 2258 08/18/17 0447 08/18/17 0659 08/18/17 0709  BP: (!) 105/56 123/77 (!) 143/92 121/68  Pulse:  (!) 58    Resp:  18    Temp:  (!) 97.4 F (36.3 C)    TempSrc:  Oral    SpO2:  97%    Weight:  215 lb 4.8 oz (97.7 kg)    Height:        Intake/Output Summary (Last 24 hours) at 08/18/2017 0817 Last data filed at 08/18/2017 0717 Gross per 24 hour  Intake 881.64 ml  Output 1802 ml  Net -920.36 ml   Filed Weights   08/16/17 2002 08/17/17 0552  08/18/17 0447  Weight: 215 lb 3.2 oz (97.6 kg) 216 lb 9.6 oz (98.2 kg) 215 lb 4.8 oz (97.7 kg)    Telemetry    Normal sinus rhythm- Personally Reviewed  ECG    Sinus rhythm with atrial bigeminy, fairly subtle T wave inversion in leads I, aVL, resolved T wave inversion in leads V1-V2- Personally Reviewed  Physical Exam  Overweight/mildly obese GEN: No acute distress.   Neck: No JVD Cardiac: RRR, no murmurs, rubs, or gallops.  Respiratory: Clear to auscultation bilaterally. GI: Soft, nontender, non-distended  MS: No edema; No deformity. Neuro:  Nonfocal  Psych: Normal affect   Labs    Chemistry Recent Labs  Lab 08/16/17 1424 08/17/17 0749  NA 133* 137  K 4.3 3.7  CL 96* 103  CO2 26 26  GLUCOSE 339* 127*  BUN 25* 28*  CREATININE 1.44* 1.18*  CALCIUM 9.0 8.4*  PROT  --  6.3*  ALBUMIN  --  3.3*  AST  --  18  ALT  --  18  ALKPHOS  --  85  BILITOT  --  0.5  GFRNONAA 37* 47*  GFRAA 42* 54*  ANIONGAP 11 8     Hematology Recent Labs  Lab 08/16/17 1424 08/17/17 0749 08/17/17 1849  WBC 16.5* 14.7*  15.7*  RBC 5.22* 4.72 4.75  HGB 14.3 12.6 12.8  HCT 43.6 40.0 40.0  MCV 83.5 84.7 84.2  MCH 27.4 26.7 26.9  MCHC 32.8 31.5 32.0  RDW 14.5 15.0 14.8  PLT 263 234 223    Cardiac Enzymes Recent Labs  Lab 08/17/17 0749 08/17/17 1244 08/17/17 1849 08/17/17 2302  TROPONINI 0.17* 0.14* 0.13* 0.13*    Recent Labs  Lab 08/16/17 1445  TROPIPOC 0.00     BNPNo results for input(s): BNP, PROBNP in the last 168 hours.   DDimer  Recent Labs  Lab 08/16/17 1635  DDIMER 0.51*     Radiology    Dg Chest 2 View  Result Date: 08/16/2017 CLINICAL DATA:  66 year old female with chest pain, midsternal radiating to left arm and neck. EXAM: CHEST  2 VIEW COMPARISON:  None. FINDINGS: The heart size and mediastinal contours are within normal limits. Both lungs are clear. The visualized skeletal structures are unremarkable. IMPRESSION: No active cardiopulmonary disease.  Electronically Signed   By: Sande Brothers M.D.   On: 08/16/2017 15:23    Cardiac Studies   Echo pending  Patient Profile     66 y.o. female with family history of coronary artery disease, roughly 4 months after radical nephrectomy for right renal cell carcinoma, hypertension, presents with accelerating exertional angina now with angina at rest relieved by sublingual nitroglycerin, mildly elevated troponin and subtle ECG changes.  Assessment & Plan    [place]: High suspicion for unstable angina/small non-ST segment elevation myocardial infarction.  We will cancel plans for stress test and schedule for left heart catheterization and possible angioplasty-stent.This procedure has been fully reviewed with the patient and written informed consent has been obtained.   CKD 2: Start intravenous fluids.  Note history of postsurgical single kidney, but creatinine has improved substantially from admission and is only slightly above normal. HTN: Well-controlled. HLP:  For questions or updates, please contact [facility] Please consult [email or web address] for contact info under Cardiology/STEMI.      Signed, Thurmon Fair, MD  08/18/2017, 8:17 AM

## 2017-08-18 NOTE — Progress Notes (Signed)
   Subjective: Mia Gonzales was seen laying in her bed comfortably. She states that she had two episodes of chest pain- one last night and one this morning that were alleviated with nitroglycerin and there were no st or t wave changes found on ekgs. The patient describes the chest pain occurring whenever she moves around or exerts herself.  Objective:  Vital signs in last 24 hours: Vitals:   08/17/17 2258 08/18/17 0447 08/18/17 0659 08/18/17 0709  BP: (!) 105/56 123/77 (!) 143/92 121/68  Pulse:  (!) 58    Resp:  18    Temp:  (!) 97.4 F (36.3 C)    TempSrc:  Oral    SpO2:  97%    Weight:  215 lb 4.8 oz (97.7 kg)    Height:       Physical Exam  Constitutional: She appears well-developed and well-nourished.  Non-toxic appearance. She does not appear ill.  HENT:  Head: Normocephalic and atraumatic.  Cardiovascular: Normal rate, regular rhythm and normal pulses.  No chest wall tenderness  Pulmonary/Chest: Effort normal and breath sounds normal. No accessory muscle usage. No respiratory distress.  Abdominal: Soft. Bowel sounds are normal. She exhibits no distension. There is no tenderness.  Neurological: She is alert.  Psychiatric: She has a normal mood and affect. Her behavior is normal. Her mood appears not anxious. She is not agitated.    Assessment/Plan:  Unstable angina/NSTEMI The patient presents with a 3-week history of chest pain that is present intermittently. Troponins have peaked and have remained the same at 0.13 therefore stopped trending them.    -Will get myoview stress test done today 08/18/17 -Continue heparin drip -Pending TTE -Continuous cardiac monitoring -Pantoprazole 40 mg daily -HbA1c pending -Voltaren gel -Continue atorvastatin 40 mg daily continue aspirin 324 mg  Essential hypertension The patient's blood pressure since admission has ranged95-143/80-92 -Started metoprolol 25 mg twice daily  ?Diabetes mellitus Pending official diagnosis of DM with  HbA1C  -SSI  Fibromyalgia -Continuegabapentin 300mg  bid -Continue venlafaxine 75mg  daily  Dispo: Anticipated discharge in approximately 1-2 day.   Lorenso Courier, MD Internal Medicine PGY1 Pager:[phone] 08/18/2017, 7:55 AM

## 2017-08-19 ENCOUNTER — Inpatient Hospital Stay (HOSPITAL_COMMUNITY): Payer: Medicare HMO

## 2017-08-19 ENCOUNTER — Encounter (HOSPITAL_COMMUNITY): Payer: Self-pay | Admitting: Interventional Cardiology

## 2017-08-19 DIAGNOSIS — Z91041 Radiographic dye allergy status: Secondary | ICD-10-CM

## 2017-08-19 DIAGNOSIS — E119 Type 2 diabetes mellitus without complications: Secondary | ICD-10-CM

## 2017-08-19 DIAGNOSIS — Z885 Allergy status to narcotic agent status: Secondary | ICD-10-CM

## 2017-08-19 DIAGNOSIS — R079 Chest pain, unspecified: Secondary | ICD-10-CM

## 2017-08-19 LAB — CBC
HEMATOCRIT: 40.3 % (ref 36.0–46.0)
Hemoglobin: 12.7 g/dL (ref 12.0–15.0)
MCH: 26.7 pg (ref 26.0–34.0)
MCHC: 31.5 g/dL (ref 30.0–36.0)
MCV: 84.7 fL (ref 78.0–100.0)
PLATELETS: 211 10*3/uL (ref 150–400)
RBC: 4.76 MIL/uL (ref 3.87–5.11)
RDW: 15 % (ref 11.5–15.5)
WBC: 12.5 10*3/uL — AB (ref 4.0–10.5)

## 2017-08-19 LAB — BASIC METABOLIC PANEL
Anion gap: 5 (ref 5–15)
Anion gap: 6 (ref 5–15)
BUN: 24 mg/dL — AB (ref 6–20)
BUN: 21 mg/dL — AB (ref 6–20)
CALCIUM: 9 mg/dL (ref 8.9–10.3)
CALCIUM: 9 mg/dL (ref 8.9–10.3)
CO2: 30 mmol/L (ref 22–32)
CO2: 28 mmol/L (ref 22–32)
CREATININE: 1.12 mg/dL — AB (ref 0.44–1.00)
Chloride: 104 mmol/L (ref 101–111)
Chloride: 104 mmol/L (ref 101–111)
Creatinine, Ser: 1.18 mg/dL — ABNORMAL HIGH (ref 0.44–1.00)
GFR calc Af Amer: 54 mL/min — ABNORMAL LOW (ref >60)
GFR calc Af Amer: 58 mL/min — ABNORMAL LOW (ref >60)
GFR, EST NON AFRICAN AMERICAN: 47 mL/min — AB (ref >60)
GFR, EST NON AFRICAN AMERICAN: 50 mL/min — AB (ref >60)
GLUCOSE: 156 mg/dL — AB (ref 65–99)
Glucose, Bld: 157 mg/dL — ABNORMAL HIGH (ref 65–99)
POTASSIUM: 4.3 mmol/L (ref 3.5–5.1)
Potassium: 4.1 mmol/L (ref 3.5–5.1)
SODIUM: 139 mmol/L (ref 135–145)
Sodium: 138 mmol/L (ref 135–145)

## 2017-08-19 LAB — GLUCOSE, CAPILLARY
GLUCOSE-CAPILLARY: 143 mg/dL — AB (ref 65–99)
Glucose-Capillary: 105 mg/dL — ABNORMAL HIGH (ref 65–99)

## 2017-08-19 LAB — ECHOCARDIOGRAM COMPLETE
Height: 67 in
WEIGHTICAEL: 3421.54 [oz_av]

## 2017-08-19 MED ORDER — ATORVASTATIN CALCIUM 40 MG PO TABS: 40.0000 mg | ORAL_TABLET | Freq: Every day | ORAL | 0 refills | Status: DC

## 2017-08-19 MED ORDER — ASPIRIN 81 MG PO TBEC: 81.0000 mg | DELAYED_RELEASE_TABLET | Freq: Every day | ORAL | 0 refills | Status: AC

## 2017-08-19 MED ORDER — TICAGRELOR 90 MG PO TABS: 90.0000 mg | ORAL_TABLET | Freq: Two times a day (BID) | ORAL | 0 refills | Status: AC

## 2017-08-19 MED ORDER — METOPROLOL SUCCINATE ER 25 MG PO TB24
25.0000 mg | ORAL_TABLET | Freq: Every day | ORAL | Status: DC
  Administered 2017-08-19: 14:00:00 25 mg via ORAL
  Filled 2017-08-19: qty 1

## 2017-08-19 MED ORDER — METOPROLOL SUCCINATE ER 25 MG PO TB24: 25.0000 mg | ORAL_TABLET | Freq: Every day | ORAL | 0 refills | Status: DC

## 2017-08-19 MED FILL — Heparin Sodium (Porcine) 2 Unit/ML in Sodium Chloride 0.9%: INTRAMUSCULAR | Qty: 500 | Status: AC

## 2017-08-19 NOTE — Discharge Instructions (Signed)
It was a pleasure to take care of you Mia Gonzales. During this hospitalization you were taken care of for your chest pain and a stent was placed into one of the vessels of your heart. Please take all the recommended medication and follow with cardiology and your primary physician closely. Thank you!    Acute Coronary Syndrome Acute coronary syndrome (ACS) is a serious problem in which there is suddenly not enough blood and oxygen supplied to the heart. ACS may mean that one or more of the blood vessels in your heart (coronary arteries) may be blocked. ACS can result in chest pain or a heart attack (myocardial infarction or MI). What are the causes? This condition is caused by atherosclerosis, which is the buildup of fat and cholesterol (plaque) on the inside of the arteries. Over time, the plaque may narrow or block the artery, and this will lessen blood flow to the heart. Plaque can also become weak and break off within a coronary artery to form a clot and cause a sudden blockage. What increases the risk? The risk factors of this condition include:  High cholesterol levels.  High blood pressure (hypertension).  Smoking.  Diabetes.  Age.  Family history of chest pain, heart disease, or stroke.  Lack of exercise.  What are the signs or symptoms? The most common signs of this condition include:  Chest pain, which can be: ? A crushing or squeezing in the chest. ? A tightness, pressure, fullness, or heaviness in the chest. ? Present for more than a few minutes, or it can stop and recur.  Pain in the arms, neck, jaw, or back.  Unexplained heartburn or indigestion.  Shortness of breath.  Nausea.  Sudden cold sweats.  Feeling light-headed or dizzy.  Sometimes, this condition has no symptoms. How is this diagnosed? ACS may be diagnosed through the following tests:  Electrocardiogram (ECG).  Blood tests.  Coronary angiogram. This is a procedure to look at the coronary  arteries to see if there is any blockage.  How is this treated? Treatment for ACS may include:  Healthy behavioral changes to reduce or control risk factors.  Medicine.  Coronary stenting.A stent helps to keep an artery open.  Coronary angioplasty. This procedure widens a narrowed or blocked artery.  Coronary artery bypass surgery. This will allow your blood to pass the blockage (bypass) to reach your heart.  Follow these instructions at home: Eating and drinking  Follow a heart-healthy diet. A dietitian can you help to educate you about healthy food options and changes.  Use healthy cooking methods such as roasting, grilling, broiling, baking, poaching, steaming, or stir-frying. Talk to a dietitian to learn more about healthy cooking methods. Medicines  Take medicines only as directed by your health care provider.  Do not take the following medicines unless your health care provider approves: ? Nonsteroidal anti-inflammatory drugs (NSAIDs), such as ibuprofen, naproxen, or celecoxib. ? Vitamin supplements that contain vitamin A, vitamin E, or both. ? Hormone replacement therapy that contains estrogen with or without progestin.  Stop illegal drug use. Activity  Follow an exercise program that is approved by your health care provider.  Plan rest periods when you are fatigued. Lifestyle  Do not use any tobacco products, including cigarettes, chewing tobacco, or electronic cigarettes. If you need help quitting, ask your health care provider.  If you drink alcohol, and your health care provider approves, limit your alcohol intake to no more than 1 drink per day. One drink equals 12  ounces of beer, 5 ounces of wine, or 1 ounces of hard liquor.  Learn to manage stress.  Maintain a healthy weight. Lose weight as approved by your health care provider. General instructions  Manage other health conditions, such as hypertension and diabetes, as directed by your health care  provider.  Keep all follow-up visits as directed by your health care provider. This is important.  Your health care provider may ask you to monitor your blood pressure. A blood pressure reading consists of a higher number over a lower number, such as 110 over 72, written as 110/72. Ideally, your blood pressure should be: ? Below 140/90 if you have no other medical conditions. ? Below 130/80 if you have diabetes or kidney disease. Get help right away if:  You have pain in your chest, neck, arm, jaw, stomach, or back that lasts more than a few minutes, is recurring, or is not relieved by taking medicine under your tongue (sublingual nitroglycerin).  You have profuse sweating without cause.  You have unexplained: ? Heartburn or indigestion. ? Shortness of breath or difficulty breathing. ? Nausea or vomiting. ? Fatigue. ? Feelings of nervousness or anxiety. ? Weakness. ? Diarrhea.  You have sudden light-headedness or dizziness.  You faint. These symptoms may represent a serious problem that is an emergency. Do not wait to see if the symptoms will go away. Get medical help right away. Call your local emergency services (911 in the [place] Do not drive yourself to the clinic or hospital. This information is not intended to replace advice given to you by your health care provider. Make sure you discuss any questions you have with your health care provider. Document Released: 09/09/2005 Document Revised: 02/21/2016 Document Reviewed: 01/11/2014 Elsevier Interactive Patient Education  2017 [employer].

## 2017-08-19 NOTE — Care Management Note (Addendum)
Case Management Note  Patient Details  Name: Azlyn Wingler MRN: [id number] Date of Birth: [birth year]  Subjective/Objective:     From home, pta indep, s/p coronary stent intervention, will be on brilinta.  NCM gave patient the 30 day savings card.  She states her MD wants her to be on brilinta for 3 months then change.  NCM gave her the co pay amt of 230.75 because deductible has not been met. She can let MD know on follow up visit.  MD can give her samples from office for 2 months or switch her to something else.                Action/Plan: DC home today.   Expected Discharge Date:                  Expected Discharge Plan:  Home/Self Care  In-House Referral:     Discharge planning Services  CM Consult  Post Acute Care Choice:    Choice offered to:     DME Arranged:    DME Agency:     HH Arranged:    [place] Agency:     Status of Service:  Completed, signed off  If discussed at [employer] of Stay Meetings, dates discussed:    Additional Comments:  Zenon Mayo, RN 08/19/2017, 11:50 AM

## 2017-08-19 NOTE — Progress Notes (Signed)
Progress Note  Patient Name: Mia Gonzales Date of Encounter: 08/19/2017  Primary Cardiologist: Duke Salvia  Subjective   Already feels much better.  No longer has the chest discomfort when walking to the bathroom.  Breathing has also improved.  Uncomfortable hematoma right forearm.  Inpatient Medications    Scheduled Meds: . aspirin EC  81 mg Oral Daily  . atorvastatin  40 mg Oral q1800  . gabapentin  300 mg Oral BID  . heparin subcutaneous  5,000 Units Subcutaneous Q8H  . insulin aspart  0-9 Units Subcutaneous TID WC  . pantoprazole  40 mg Oral Daily  . sodium chloride flush  3 mL Intravenous Q12H  . sodium chloride flush  3 mL Intravenous Q12H  . ticagrelor  90 mg Oral BID  . venlafaxine XR  75 mg Oral BID   Continuous Infusions: . sodium chloride     PRN Meds: sodium chloride, acetaminophen, diclofenac sodium, nitroGLYCERIN, ondansetron (ZOFRAN) IV, promethazine, sodium chloride flush   Vital Signs    Vitals:   08/18/17 1950 08/18/17 2000 08/19/17 0421 08/19/17 0812  BP: (!) 149/97  (!) 169/71 (!) 146/67  Pulse: 66 65 68 89  Resp: 15 (!) 24 18 18   Temp: 97.8 F (36.6 C)  97.8 F (36.6 C) 97.9 F (36.6 C)  TempSrc: Oral  Oral Oral  SpO2: 99% 98% 97% 98%  Weight:   213 lb 13.5 oz (97 kg)   Height:        Intake/Output Summary (Last 24 hours) at 08/19/2017 1106 Last data filed at 08/19/2017 0700 Gross per 24 hour  Intake 809.51 ml  Output 900 ml  Net -90.49 ml   Filed Weights   08/17/17 0552 08/18/17 0447 08/19/17 0421  Weight: 216 lb 9.6 oz (98.2 kg) 215 lb 4.8 oz (97.7 kg) 213 lb 13.5 oz (97 kg)    Telemetry    Normal sinus rhythm- Personally Reviewed  ECG    Normal sinus rhythm, normal tracing - Personally Reviewed  Physical Exam  Mildly obese GEN: No acute distress.   Neck: No JVD Cardiac: RRR, no murmurs, rubs, or gallops.  Respiratory: Clear to auscultation bilaterally. GI: Soft, nontender, non-distended  MS:  Moderate size hematoma  right forearm with palpable radial pulse and without evidence of compartment syndrome.  Hand appears well vascularized with good capillary refill and without discoloration Neuro:  Nonfocal  Psych: Normal affect   Labs    Chemistry Recent Labs  Lab 08/17/17 0749 08/18/17 0747 08/19/17 0335 08/19/17 0946  NA 137 138 139 138  K 3.7 4.2 4.3 4.1  CL 103 104 104 104  CO2 26 23 30 28   GLUCOSE 127* 129* 157* 156*  BUN 28* 31* 24* 21*  CREATININE 1.18* 1.40* 1.18* 1.12*  CALCIUM 8.4* 8.5* 9.0 9.0  PROT 6.3*  --   --   --   ALBUMIN 3.3*  --   --   --   AST 18  --   --   --   ALT 18  --   --   --   ALKPHOS 85  --   --   --   BILITOT 0.5  --   --   --   GFRNONAA 47* 38* 47* 50*  GFRAA 54* 44* 54* 58*  ANIONGAP 8 11 5 6      Hematology Recent Labs  Lab 08/17/17 1849 08/18/17 0747 08/19/17 0335  WBC 15.7* 12.6* 12.5*  RBC 4.75 5.00 4.76  HGB 12.8 13.7 12.7  HCT 40.0 42.6 40.3  MCV 84.2 85.2 84.7  MCH 26.9 27.4 26.7  MCHC 32.0 32.2 31.5  RDW 14.8 15.4 15.0  PLT 223 PLATELET CLUMPS NOTED ON SMEAR, UNABLE TO ESTIMATE 211    Cardiac Enzymes Recent Labs  Lab 08/17/17 1244 08/17/17 1849 08/17/17 2302 08/18/17 0747  TROPONINI 0.14* 0.13* 0.13* 0.12*    Recent Labs  Lab 08/16/17 1445  TROPIPOC 0.00     BNPNo results for input(s): BNP, PROBNP in the last 168 hours.   DDimer  Recent Labs  Lab 08/16/17 1635  DDIMER 0.51*     Radiology    No results found.  Cardiac Studies   Echo 08/19/2017 - Left ventricle: The cavity size was normal. Wall thickness was   increased in a pattern of moderate LVH. Systolic function was   vigorous. The estimated ejection fraction was in the range of 65%   to 70%. Wall motion was normal; there were no regional wall   motion abnormalities. Doppler parameters are consistent with   abnormal left ventricular relaxation (grade 1 diastolic   dysfunction). The E/e&' ratio is between 8-15, suggesting   indeterminate LV filling  pressure. - Aortic valve: Trileaflet. Sclerosis without stenosis. There was   no regurgitation. - Mitral valve: Mildly thickened leaflets . There was trivial   regurgitation. - Left atrium: The atrium was normal in size. - Inferior vena cava: The vessel was normal in size. The   respirophasic diameter changes were in the normal range (>= 50%),   consistent with normal central venous pressure.  Impressions:  - LVEF 65-70%, moderate LVH, normal wall motion, grade 1 DD,   indeterminate LV filling pressure, aortic valve sclerosis,   trivial MR, normal LA size, normal IVC.  Patient Profile     66 y.o. female with history of essential hypertension and recent radical nephrectomy for right renal cell carcinoma presenting with unstable angina/small non-STEMI, now 1 day after placement of drug-eluting stent to the mid right coronary artery.  Assessment & Plan    1.  CAD: Essentially single vessel disease, with mild plaquing of the other coronary arteries, successful drug-eluting stent to acute ulcerated plaque in the mid RCA.  Discussed the need for mandatory uninterrupted dual antiplatelet therapy for the next 12 months. 2. CKD2: Creatinine after procedure 1.12. 3.  HTN: Start low-dose beta-blocker 4.  HLP: On high-dose statin, target LDL under 70.  Okay for discharge later today.  For questions or updates, please contact [facility] Please consult [email or web address] for contact info under Cardiology/STEMI.      Signed, Thurmon FairMihai Rachele Lamaster, MD  08/19/2017, 11:06 AM

## 2017-08-19 NOTE — Progress Notes (Signed)
CARDIAC REHAB PHASE I   PRE:  Rate/Rhythm: 76 SR  BP:  Supine: 139/85  Sitting:   Standing:    SaO2:   MODE:  Ambulation: 500 ft   POST:  Rate/Rhythm: 108 ST  BP:  Supine:   Sitting: 157/80  Standing:    SaO2:  0945-1035 Pt walked 500 ft with steady gait and tolerated well. No CP. MI education completed with pt and sister who voiced understanding. Stressed importance of brilinta with stent. Needs to see case manager. Reviewed NTG use, risk factors, MI restrictions, ex ed , heart healthy and carb counting, CRP 2. Will refer to [place] program.   Luetta Nutting, RN BSN  08/19/2017 10:31 AM

## 2017-08-19 NOTE — Discharge Summary (Signed)
Name: Mia Gonzales MRN: [id number] DOB: 03/30/1951 66 y.o. PCP: Patient, No Pcp Per  Date of Admission: 08/16/2017  3:23 PM Date of Discharge: 08/19/2017 Attending Physician: Earl Lagos, MD  Discharge Diagnosis:    Chest pain   Non-ST elevation MI (NSTEMI) [facility])   Pure hypercholesterolemia   NSTEMI (non-ST elevated myocardial infarction) [facility])  Discharge Medications: Allergies as of 08/19/2017      Reactions   Povidone-iodine Swelling   Swelling locally when used around eye   Tramadol-acetaminophen    Other reaction(s): Vomiting   Codeine Palpitations   Fentanyl Itching      Medication List    STOP taking these medications   cefdinir 300 MG capsule Commonly known as:  OMNICEF   diclofenac 75 MG EC tablet Commonly known as:  VOLTAREN   predniSONE 10 MG tablet Commonly known as:  DELTASONE     TAKE these medications   aspirin 81 MG EC tablet Take 1 tablet (81 mg total) by mouth daily. Start taking on:  08/20/2017   atorvastatin 40 MG tablet Commonly known as:  LIPITOR Take 1 tablet (40 mg total) by mouth daily at 6 PM.   gabapentin 300 MG capsule Commonly known as:  NEURONTIN Take 300 mg by mouth 2 (two) times daily.   ICAPS AREDS 2 PO Take 1 tablet by mouth 2 (two) times daily.   metoprolol succinate 25 MG 24 hr tablet Commonly known as:  TOPROL-XL Take 1 tablet (25 mg total) by mouth daily. Start taking on:  08/20/2017   ticagrelor 90 MG Tabs tablet Commonly known as:  BRILINTA Take 1 tablet (90 mg total) by mouth 2 (two) times daily.   venlafaxine XR 75 MG 24 hr capsule Commonly known as:  EFFEXOR-XR Take 75 mg by mouth 2 (two) times daily.   Vitamin D3 10000 units Tabs Take 5,000 Units by mouth daily.       Disposition and follow-up:   Mia Gonzales was discharged from [facility] in stable condition.  At the hospital follow up visit please address:  1.  NSTEMI: continue dual antiplatelet agents and high intensity  statin   Diabetes mellitus: start an antihyperglycemic agent  2.  Labs / imaging needed at time of follow-up: none  3.  Pending labs/ test needing follow-up: none  Follow-up Appointments: Follow-up Information    Chilton Si, MD Follow up.   Specialty:  Cardiology Why:  Office will call you to schedule your follow-u appointment. Please call the office if you have not heard back within 3 days. Contact information: [address] [place] [address] [place] [address] [phone]        Caffie Damme, MD Follow up in 1 week(s).   Specialty:  Family Medicine Contact information: [address] [place] [address] [phone]           Hospital Course by problem list:  NSTEMI (non-ST elevated myocardial infarction) [facility]) The patient presents with a 3-week history of chest pain that is present intermittently, worsens with exertion, and relieved by nitroglycerin. The patient's presentation, risk factors, uptrending troponin (0,0.03, 0.08, 0.17) and accompanied EKG changes (ST depression in lead II) placed concern for ACS. The patient underwent a catheterization 08/18/17 which revealed 99% mid RCA stenosis and a drug eluting stent was placed. The patient also had 25% stenosis in the 1st marginal artery, 25% stenosis in the mid circumflex, 20% stenosis in the mid LAD. The patient was given tirofiban 2 hours post procedure. The patient  had an echo done which showed 65-70% ejection fraction, no regional wall motion abnormalities, moderate LVH, grade 1 diastolic dysfunciton. The patient was discharged on brillinta and aspirin with close follow up with cardiology and her primary care provider.   Diabetes Mellitus A diagnosis of diabetes was made with a HbA1c of 7.6. The patient was given sliding scale insulin while in the hospital. The patient will need to be prescribed an antihyperglycemic agent outpatient.    Discharge Vitals:   BP (!) 155/74   Pulse 76   Temp 98 F (36.7 C)  (Oral)   Resp 17   Ht 5\' 7"  (1.702 m)   Wt 213 lb 13.5 oz (97 kg)   SpO2 98%   BMI 33.49 kg/m   Pertinent Labs, Studies, and Procedures:   BMP Latest Ref Rng & Units 08/19/2017 08/19/2017 08/18/2017  Glucose 65 - 99 mg/dL 161(W156(H) 960(A157(H) 540(J129(H)  BUN 6 - 20 mg/dL 81(X21(H) 91(Y24(H) 78(G31(H)  Creatinine 0.44 - 1.00 mg/dL 9.56(O1.12(H) 1.30(Q1.18(H) 6.57(Q1.40(H)  Sodium 135 - 145 mmol/L 138 139 138  Potassium 3.5 - 5.1 mmol/L 4.1 4.3 4.2  Chloride 101 - 111 mmol/L 104 104 104  CO2 22 - 32 mmol/L 28 30 23   Calcium 8.9 - 10.3 mg/dL 9.0 9.0 4.6(N8.5(L)   CBC    Component Value Date/Time   WBC 12.5 (H) 08/19/2017 0335   RBC 4.76 08/19/2017 0335   HGB 12.7 08/19/2017 0335   HCT 40.3 08/19/2017 0335   PLT 211 08/19/2017 0335   MCV 84.7 08/19/2017 0335   MCH 26.7 08/19/2017 0335   MCHC 31.5 08/19/2017 0335   RDW 15.0 08/19/2017 0335   Troponin: 0.03, 0.08, 0.17, 0.14, 0.13, 0.13, 0.12  PT=13.3, INR=1.02    Discharge Instructions: Discharge Instructions    Amb Referral to Cardiac Rehabilitation   Complete by:  As directed    Diagnosis:   NSTEMI Coronary Stents     Call MD for:  extreme fatigue   Complete by:  As directed    Call MD for:  persistant dizziness or light-headedness   Complete by:  As directed    Call MD for:  persistant nausea and vomiting   Complete by:  As directed    Diet - low sodium heart healthy   Complete by:  As directed    Increase activity slowly   Complete by:  As directed       Signed: Lorenso CourierVahini Shalea Tomczak, MD Internal Medicine PGY1 Pager:[phone] 08/19/2017, 2:14 PM

## 2017-08-19 NOTE — Progress Notes (Signed)
#  1.  S/W CHRISTOPHER @ [facility] # [phone]   1. BRINILTA  90 MG BID   COVER- YES  CO-PAY- $ 230.75  TIER- 3 DRUG  PRIOR APPROVAL- NO  DEDUCTIBLE : NOT MET   PREFERRED PHARMACY : [facility]

## 2017-08-19 NOTE — Progress Notes (Signed)
I was unable to reach schedulers by phone so have sent a message to our [facility] office's scheduler requesting a TOC follow-up appt for NSTEMI, and our office will call the patient with this information. Dayna Dunn PA-C

## 2017-08-19 NOTE — Progress Notes (Signed)
   Subjective: Mia Gonzales was seen resting in her bed this morning. She states that she is doing well and does not have any chest pain or shortness of breath. She expressed understanding of the stent placement and post stent care.   Objective:  Vital signs in last 24 hours: Vitals:   08/18/17 2000 08/19/17 0421 08/19/17 0812 08/19/17 1200  BP:  (!) 169/71 (!) 146/67 (!) 155/74  Pulse: 65 68 89 76  Resp: (!) 24 18 18 17   Temp:  97.8 F (36.6 C) 97.9 F (36.6 C) 98 F (36.7 C)  TempSrc:  Oral Oral Oral  SpO2: 98% 97% 98% 98%  Weight:  213 lb 13.5 oz (97 kg)    Height:       Physical Exam  Constitutional: She appears well-developed and well-nourished.  Non-toxic appearance. She does not appear ill.  HENT:  Head: Normocephalic and atraumatic.  Cardiovascular: Normal rate, regular rhythm and normal pulses.  Pulmonary/Chest: Effort normal and breath sounds normal. No accessory muscle usage. No respiratory distress. She has no decreased breath sounds.  Abdominal: Soft. Bowel sounds are normal. She exhibits no distension. There is no tenderness.  Musculoskeletal: Normal range of motion.       Right lower leg: She exhibits no edema.       Left lower leg: She exhibits no edema.  Neurological: She is alert.  Psychiatric: She has a normal mood and affect. Her behavior is normal. She is not agitated.   Assessment/Plan:  Unstable angina/NSTEMI The patient had cath done yesterday 08/18/17 which showed :   Mid RCA lesion is 99% stenosed. A drug-eluting stent was successfully placed using a STENT SIERRA 3.00 X 38 MM, postdilated to > 3.5 mm.  Post intervention, there is a 0% residual stenosis.  1st Mrg lesion is 25% stenosed.  Mid Cx lesion is 25% stenosed.  Mid LAD lesion is 20% stenosed.  The left ventricular ejection fraction is 55-65% by visual estimate.  LV end diastolic pressure is mildly elevated. LVEDP 21 mm Hg.  The left ventricular systolic function is normal.  There  is no aortic valve stenosis.  -TTE done today 08/19/17 showed 65-70% LVEF, no regional wall motion abnormalities, grade 1 diastolic dysfunction, mildly thickened mitral leaflet, moderate LVH  -Started dual platelet therapy-aspirin 81 mg, Brilinta 90 mg twice daily -Continue atorvastatin 40 mg daily  Essential hypertension The patient's blood pressure since admission has ranged146-155/67-74 -Continue metoprolol 25 mg twice daily  Diabetes mellitus HbA1C=7.6  -Was on SSI in the hospital -Will need to be started on a oral anti-hyperglycemic as an outpatient  Fibromyalgia -Continuegabapentin 300mg  bid -Continue venlafaxine 75mg  daily   Dispo: Anticipated discharge today   Lorenso Courier, MD Internal Medicine PGY1 [phone] 08/19/2017, 1:59 PM

## 2017-08-19 NOTE — Progress Notes (Signed)
  Echocardiogram 2D Echocardiogram has been performed.  Mia Gonzales 08/19/2017, 9:07 AM

## 2017-08-22 ENCOUNTER — Telehealth (HOSPITAL_COMMUNITY): Payer: Self-pay

## 2017-08-22 NOTE — Telephone Encounter (Signed)
Called Dr.Zavala's office in regards to follow up appt - Patient has appt scheduled for 08/26/2017. Paperwork is in [employer].

## 2017-08-26 ENCOUNTER — Ambulatory Visit: Payer: Medicare HMO | Admitting: Cardiology

## 2017-08-26 ENCOUNTER — Other Ambulatory Visit: Payer: Self-pay | Admitting: *Deleted

## 2017-08-26 ENCOUNTER — Ambulatory Visit: Payer: Medicare HMO | Admitting: Cardiovascular Disease

## 2017-08-26 ENCOUNTER — Encounter: Payer: Self-pay | Admitting: Cardiovascular Disease

## 2017-08-26 ENCOUNTER — Encounter: Payer: Self-pay | Admitting: *Deleted

## 2017-08-26 VITALS — BP 111/80 | HR 74 | Ht 67.0 in | Wt 207.2 lb

## 2017-08-26 DIAGNOSIS — I10 Essential (primary) hypertension: Secondary | ICD-10-CM | POA: Diagnosis not present

## 2017-08-26 DIAGNOSIS — E7800 Pure hypercholesterolemia, unspecified: Secondary | ICD-10-CM

## 2017-08-26 DIAGNOSIS — I252 Old myocardial infarction: Secondary | ICD-10-CM | POA: Diagnosis not present

## 2017-08-26 MED ORDER — NITROGLYCERIN 0.4 MG SL SUBL: 0.4000 mg | SUBLINGUAL_TABLET | SUBLINGUAL | 12 refills | Status: AC | PRN

## 2017-08-26 MED ORDER — NITROGLYCERIN 0.4 MG/SPRAY TL SOLN: 1.0000 | 12 refills | Status: DC | PRN

## 2017-08-26 NOTE — Progress Notes (Signed)
Cardiology Office Note   Date:  08/26/2017   ID:  Mia Gonzales, DOB 1950/10/04, MRN [id number]  PCP:  Mia Damme, MD  Cardiologist:   Mia Si, MD   No chief complaint on file.    History of Present Illness: Mia Gonzales is a 66 y.o. female with hyperlipidemia, asthma, prior tobacco abuse, right renal cell carcinoma status post radical nephrectomy and diabetes who presents for follow-up.  She was initially seen 12/2016 for an evaluation of shortness of breath. Mia Gonzales saw Dr. Caffie Gonzales on 12/28/16 and reported exertional dyspnea. Her blood pressure was noted to be elevated to 144/88, which was a new finding.  She was referred to cardiology for further evaluation.  She was also concerned because her older sister recently had a heart attack and another sister had peripartum cardiomyopathy.  She reported an episode of pleuritic chest pain and had an ETT 12/2016 that revealed nondiagnostic changes in the inferolateral leads.  She achieved 7 metastases on a Bruce protocol.  Since her last appointment Ms. Mia Gonzales was diagnosed with renal cell carcinoma.  She underwent R radical nephrectomy 03/2017.  She presented 08/16/17 with NSTEMI.   Her symptoms began 3 weeks prior to presentation with exertional dyspnea and chest discomfort.  One week prior to going to [facility] she was seen at [facility] and was diagnosed with costochondritis.  She presented to [facility] and troponin peaked at 0.14.  Her chest pain improved with nitroglycerin.  She underwent left heart catheterization and was found to have 99% mid RCA stenosis with an ulcerated plaque.  She had otherwise nonobstructive disease.  She had a DES in the RCA and felt immediate relief.  Echo that admission revealed LVEF 65-70% with grade 1 diastolic dysfunction and aortic valve sclerosis without stenosis.  She was started on aspirin, ticagrelor, metoprolol and atorvastatin.  Since leaving the hospital she has felt well.  She has been walking for exercise  and has no chest pain or shortness of breath.    She has not noted any f lower extremity edema, orthopnea, or PND.  She has some slight discomfort in her wrist from the cath site but is otherwise well.  She has been working on her diet and has lost 7 pounds since hospitalization.  She notes that her co-pay for ticagrelor is over $200 and she is unable to afford this.   Past Medical History:  Diagnosis Date  . Asthma 01/06/2017  . Essential hypertension 01/06/2017  . Fibromyalgia   . Pleuritic chest pain 01/06/2017  . Shortness of breath 01/06/2017    Past Surgical History:  Procedure Laterality Date  . CHOLECYSTECTOMY    . CORONARY STENT INTERVENTION N/A 08/18/2017   Procedure: CORONARY STENT INTERVENTION;  Surgeon: Corky Crafts, MD;  Location: [facility]AS[facility] CV LAB;  Service: Cardiovascular;  Laterality: N/A;  . LEFT HEART CATH AND CORONARY ANGIOGRAPHY N/A 08/18/2017   Procedure: LEFT HEART CATH AND CORONARY ANGIOGRAPHY;  Surgeon: Corky Crafts, MD;  Location: [facility] INVASIVE CV LAB;  Service: Cardiovascular;  Laterality: N/A;  . NEPHRECTOMY       Current Outpatient Medications  Medication Sig Dispense Refill  . aspirin EC 81 MG EC tablet Take 1 tablet (81 mg total) by mouth daily. 30 tablet 0  . atorvastatin (LIPITOR) 40 MG tablet Take 1 tablet (40 mg total) by mouth daily at 6 PM. 30 tablet 0  . Cholecalciferol (VITAMIN D3) 10000 units TABS Take 5,000 Units by mouth daily.    Marland Kitchen  gabapentin (NEURONTIN) 300 MG capsule Take 300 mg by mouth 2 (two) times daily.     . metoprolol succinate (TOPROL-XL) 25 MG 24 hr tablet Take 1 tablet (25 mg total) by mouth daily. 30 tablet 0  . Multiple Vitamins-Minerals (ICAPS AREDS 2 PO) Take 1 tablet by mouth 2 (two) times daily.    . ticagrelor (BRILINTA) 90 MG TABS tablet Take 1 tablet (90 mg total) by mouth 2 (two) times daily. 60 tablet 0  . venlafaxine XR (EFFEXOR-XR) 75 MG 24 hr capsule Take 75 mg by mouth 2 (two) times daily.     .  nitroGLYCERIN (NITROLINGUAL) 0.4 MG/SPRAY spray Place 1 spray under the tongue every 5 (five) minutes x 3 doses as needed for chest pain. 12 g 12   No current facility-administered medications for this visit.     Allergies:   Povidone-iodine; Tramadol-acetaminophen; Codeine; and Fentanyl    Social History:  The patient  reports that she has quit smoking. she has never used smokeless tobacco. She reports that she does not drink alcohol or use drugs.   Family History:  The patient's family history includes CAD in her sister; Cancer in her father and mother; Cardiomyopathy in her sister; Diabetes in her father and paternal grandmother; Heart attack in her maternal grandfather and sister; Heart disease in her maternal grandfather and maternal grandmother; Rheum arthritis in her mother.    ROS:  Please see the history of present illness.   Otherwise, review of systems are positive for none.   All other systems are reviewed and negative.    PHYSICAL EXAM: VS:  BP 111/80   Pulse 74   Ht 5\' 7"  (1.702 m)   Wt 207 lb 3.2 oz (94 kg)   BMI 32.45 kg/m  , BMI Body mass index is 32.45 kg/m. GENERAL:  Well appearing HEENT: Pupils equal round and reactive, fundi not visualized, oral mucosa unremarkable NECK:  No jugular venous distention, waveform within normal limits, carotid upstroke brisk and symmetric, no bruits, no thyromegaly LUNGS:  Clear to auscultation bilaterally HEART:  RRR.  PMI not displaced or sustained,S1 and S2 within normal limits, no S3, no S4, no clicks, no rubs, no murmurs ABD:  Flat, positive bowel sounds normal in frequency in pitch, no bruits, no rebound, no guarding, no midline pulsatile mass, no hepatomegaly, no splenomegaly EXT:  2 plus pulses throughout, no edema, no cyanosis no clubbing.  Induration over R radial cath site. Mild ecchymosis.  SKIN:  No rashes no nodules NEURO:  Cranial nerves II through XII grossly intact, motor grossly intact throughout PSYCH:  Cognitively  intact, oriented to person place and time    EKG:  EKG is ordered today. The ekg ordered 01/02/17 demonstrates sinus rhythm.  Rate 73 bpm.  PACs.   08/26/17: Sinus rhythm.  Rate 74 bpm.  Inferior T wave abnormalities.  ETT 01/14/17:  Blood pressure demonstrated a hypertensive response to exercise.  There was no ST segment deviation noted during stress.   ETT with fair exercise tolerance (5:46); no chest pain; hypertensive BP response; borderline ST changes in the inferolateral leads felt to be nondiagnostic; negative adequate ETT; Duke treadmill score 6.  LHC 08/18/17:  Mid RCA lesion is 99% stenosed. A drug-eluting stent was successfully placed using a STENT SIERRA 3.00 X 38 MM, postdilated to > 3.5 mm.  Post intervention, there is a 0% residual stenosis.  1st Mrg lesion is 25% stenosed.  Mid Cx lesion is 25% stenosed.  Mid LAD lesion  is 20% stenosed.  The left ventricular ejection fraction is 55-65% by visual estimate.  LV end diastolic pressure is mildly elevated. LVEDP 21 mm Hg.  The left ventricular systolic function is normal.  There is no aortic valve stenosis.   Echo 08/19/17: Study Conclusions  - Left ventricle: The cavity size was normal. Wall thickness was   increased in a pattern of moderate LVH. Systolic function was   vigorous. The estimated ejection fraction was in the range of 65%   to 70%. Wall motion was normal; there were no regional wall   motion abnormalities. Doppler parameters are consistent with   abnormal left ventricular relaxation (grade 1 diastolic   dysfunction). The E/e&' ratio is between 8-15, suggesting   indeterminate LV filling pressure. - Aortic valve: Trileaflet. Sclerosis without stenosis. There was   no regurgitation. - Mitral valve: Mildly thickened leaflets . There was trivial   regurgitation. - Left atrium: The atrium was normal in size. - Inferior vena cava: The vessel was normal in size. The   respirophasic diameter  changes were in the normal range (>= 50%),   consistent with normal central venous pressure.  Impressions:  - LVEF 65-70%, moderate LVH, normal wall motion, grade 1 DD,   indeterminate LV filling pressure, aortic valve sclerosis,   trivial MR, normal LA size, normal IVC.   Recent Labs: 08/17/2017: ALT 18 08/19/2017: BUN 21; Creatinine, Ser 1.12; Hemoglobin 12.7; Platelets 211; Potassium 4.1; Sodium 138   11/07/16: A1c 7.2% Total cholesterol 256, triglycerides 314, HDL 33, LDL 169 sodium 141, potassium 4.2, BUN 21, creatinine 1.0 AST 15, ALT 13 WBC 10.1, hemoglobin 13.6, hematocrit 45.1, platelets 257 TSH 1.65 Free T4 1.41  Lipid Panel    Component Value Date/Time   CHOL 205 (H) 08/17/2017 0015   TRIG 267 (H) 08/17/2017 0015   HDL 31 (L) 08/17/2017 0015   CHOLHDL 6.6 08/17/2017 0015   VLDL 53 (H) 08/17/2017 0015   LDLCALC 121 (H) 08/17/2017 0015      Wt Readings from Last 3 Encounters:  08/26/17 207 lb 3.2 oz (94 kg)  08/19/17 213 lb 13.5 oz (97 kg)  01/02/17 216 lb 3.2 oz (98.1 kg)      ASSESSMENT AND PLAN:  # CAD: # Hyperlipidemia: Ms. Ohagan had a NSTEMI 08/16/17.  She underwent placement of  a drug-eluting stent in the right coronary artery.  She had otherwise nonobstructive coronary disease.  A she was started on atorvastatin.  We will repeat lipids and CMP in 1 month.  Continue aspirin, ticagrelor, and metoprolol.  Plan for 12 months of dual antiplatelet therapy.  We will try to get her samples of ticagrelor today.  Ideally she would be on this for 1 year.  She is unable to get samples or for the medicine we will switch her to clopidogrel after 3 months.  We will follow-up on cardiac rehab and hopefully get her enrolled in this program.  # Hypertension: BP well-controlled.  Continue metoprolol.   Current medicines are reviewed at length with the patient today.  The patient does not have concerns regarding medicines.  The following changes have been made:  no  change  Labs/ tests ordered today include:   Orders Placed This Encounter  Procedures  . Lipid panel  . Comprehensive metabolic panel  . AMB referral to cardiac rehabilitation  . EKG 12-Lead     Disposition:   FU with Margaretmary Prisk C. Duke Salvia, MD, [facility] in 6 months.     This note  was written with the assistance of speech recognition software.  Please excuse any transcriptional errors.  Signed, Yasiel Goyne C. Duke Salviaandolph, MD, [facility]ACC  08/26/2017 9:59 AM    [facility]

## 2017-08-26 NOTE — Patient Instructions (Addendum)
Medication Instructions:  Use your NTG spray under your tongue for recurrent chest pain. May take one tablet every 5 minutes. If you are still having discomfort after 3 tablets in 15 minutes, call 911.  Labwork: FASTING LP/CMET IN 1 MONTH  Testing/Procedures: NONE  Follow-Up: Your physician wants you to follow-up in: 6 MONTH OV You will receive a reminder letter in the mail two months in advance. If you don't receive a letter, please call our office to schedule the follow-up appointment.  IF YOU DO NOT HEAR FROM CARDIAC REHAB BY THE END OF THE WEEK CALL THE OFFICE AT [phone]  If you need a refill on your cardiac medications before your next appointment, please call your pharmacy.

## 2017-08-29 ENCOUNTER — Telehealth (HOSPITAL_COMMUNITY): Payer: Self-pay

## 2017-08-29 ENCOUNTER — Telehealth: Payer: Self-pay | Admitting: Cardiovascular Disease

## 2017-08-29 NOTE — Telephone Encounter (Signed)
Noted  

## 2017-08-29 NOTE — Telephone Encounter (Signed)
She wanted you to know that her insurance company did get her paper work.

## 2017-08-29 NOTE — Telephone Encounter (Signed)
Called and spoke with patient in regards to Cardiac Rehab - Scheduled orientation on 10/02/2017 at 1:30pm. Patient will attend the 2:45pm exc class.

## 2017-09-15 ENCOUNTER — Other Ambulatory Visit: Payer: Self-pay | Admitting: Cardiovascular Disease

## 2017-09-15 ENCOUNTER — Telehealth: Payer: Self-pay | Admitting: Cardiovascular Disease

## 2017-09-15 MED ORDER — ATORVASTATIN CALCIUM 40 MG PO TABS: 40.0000 mg | ORAL_TABLET | Freq: Every day | ORAL | 1 refills | Status: DC

## 2017-09-15 MED ORDER — METOPROLOL SUCCINATE ER 25 MG PO TB24: 25.0000 mg | ORAL_TABLET | Freq: Every day | ORAL | 1 refills | Status: DC

## 2017-09-15 NOTE — Telephone Encounter (Signed)
Called placed to the patient. She stated that she is out of town and needs her prescription called into the [facility] in [place], [place]. This has been done per her request. She verbalized her understanding.

## 2017-09-15 NOTE — Telephone Encounter (Signed)
Called returned to the patient to inform her that the medication has been refilled per her request: Atorvastatin and Metoprolol.

## 2017-09-15 NOTE — Telephone Encounter (Signed)
°*  STAT* If patient is at the pharmacy, call can be transferred to refill team.   1. Which medications need to be refilled? (please list name of each medication and dose if known) new prescription -Metoprolol,Atorvastatin 2. Which pharmacy/location (including street and city if local pharmacy) is medication to be sent to?[facility] [phone] 3. Do they need a 30 day or 90 day supply? 90 days and refills

## 2017-09-15 NOTE — Telephone Encounter (Signed)
New Message     Prescriptions were called into the wrong pharmacy they need to be called into [facility] in [place] = [phone]    *STAT* If patient is at the pharmacy, call can be transferred to refill team.   1. Which medications need to be refilled? (please list name of each medication and dose if known)  atorvastatin (LIPITOR) 40 MG tablet Take 1 tablet (40 mg total) by mouth daily at 6 PM.   metoprolol succinate (TOPROL-XL) 25 MG 24 hr tablet Take 1 tablet (25 mg total) by mouth daily     2. Which pharmacy/location (including street and city if local pharmacy) is medication to be sent to? [facility] [place]  3. Do they need a 30 day or 90 day supply? 90

## 2017-09-30 ENCOUNTER — Telehealth (HOSPITAL_COMMUNITY): Payer: Self-pay

## 2017-09-30 NOTE — Telephone Encounter (Signed)
Cardiac Rehab Medication Review by a Pharmacist  Does the patient  feel that his/her medications are working for him/her?  yes  Has the patient been experiencing any side effects to the medications prescribed?  no  Does the patient measure his/her own blood pressure or blood glucose at home?  no   Does the patient have any problems obtaining medications due to transportation or finances?   no  Understanding of regimen: excellent Understanding of indications: excellent Potential of compliance: excellent   Pharmacist comments: Patient endorses medication compliance and denies any any adverse effects. Notable, she was given samples of ticagrelor by her cardiologist since her insurance co-pay is unaffordable for this medication. She currently has about a 57-month supply. She will need another month's supply to complete the intended 5-month course.  Adline Potter, PharmD Pharmacy Resident Pager: [phone]

## 2017-10-02 ENCOUNTER — Telehealth: Payer: Self-pay | Admitting: Cardiovascular Disease

## 2017-10-02 ENCOUNTER — Encounter (HOSPITAL_COMMUNITY): Payer: Self-pay

## 2017-10-02 ENCOUNTER — Encounter (HOSPITAL_COMMUNITY)
Admission: RE | Admit: 2017-10-02 | Discharge: 2017-10-02 | Disposition: A | Discharge status: Home / Self Care | Payer: Medicare HMO | Source: Ambulatory Visit | Attending: Cardiovascular Disease | Admitting: Cardiovascular Disease

## 2017-10-02 VITALS — BP 108/80 | HR 60 | Ht 65.0 in | Wt 212.5 lb

## 2017-10-02 DIAGNOSIS — Z955 Presence of coronary angioplasty implant and graft: Secondary | ICD-10-CM

## 2017-10-02 DIAGNOSIS — I214 Non-ST elevation (NSTEMI) myocardial infarction: Secondary | ICD-10-CM

## 2017-10-02 HISTORY — DX: Atherosclerotic heart disease of native coronary artery without angina pectoris: I25.10

## 2017-10-02 NOTE — Progress Notes (Signed)
Mia Gonzales is here today for orientation today. Mia Gonzales reports that she has been feeling fatigued and occasionally has shortness of breath. Mia Gonzales is taking Brilinta twice a day. Blood pressure 108/80. Oxygen saturation 98% on room air. Telemetry rhythm Sinus rate 72. Will notify Dr Leonides Sake office about Mia Gonzales's complaints.Gladstone Lighter, RN,BSN 10/02/2017 2:27 PM

## 2017-10-02 NOTE — Telephone Encounter (Signed)
New Message  Mia Gonzales call to speak with RN pt is in office with fatigue  And some SOB

## 2017-10-02 NOTE — Progress Notes (Signed)
Cardiac Individual Treatment Plan  Patient Details  Name: Mia Gonzales MRN: [id number] Date of Birth: 15-Sep-1951 Referring Provider:     CARDIAC REHAB PHASE II ORIENTATION from 10/02/2017 in [facility] CARDIAC REHAB  Referring Provider  Chilton Si MD      Initial Encounter Date:    CARDIAC REHAB PHASE II ORIENTATION from 10/02/2017 in [facility] CARDIAC REHAB  Date  10/02/17  Referring Provider  Chilton Si MD      Visit Diagnosis: NSTEMI (non-ST elevated myocardial infarction) (HCC) 08/16/17  S/P coronary artery stent placement 08/18/17 [facility]  Patient's Home Medications on Admission:  Current Outpatient Medications:  .  atorvastatin (LIPITOR) 40 MG tablet, Take 1 tablet (40 mg total) by mouth daily at 6 PM., Disp: 90 tablet, Rfl: 1 .  Cholecalciferol (VITAMIN D3) 10000 units TABS, Take 5,000 Units by mouth daily., Disp: , Rfl:  .  gabapentin (NEURONTIN) 300 MG capsule, Take 300 mg by mouth 2 (two) times daily. , Disp: , Rfl:  .  metoprolol succinate (TOPROL-XL) 25 MG 24 hr tablet, Take 1 tablet (25 mg total) by mouth daily., Disp: 90 tablet, Rfl: 1 .  Multiple Vitamins-Minerals (ICAPS AREDS 2 PO), Take 1 tablet by mouth 2 (two) times daily., Disp: , Rfl:  .  nitroGLYCERIN (NITROSTAT) 0.4 MG SL tablet, Place 1 tablet (0.4 mg total) under the tongue every 5 (five) minutes as needed for chest pain., Disp: 25 tablet, Rfl: 12 .  ticagrelor (BRILINTA) 90 MG TABS tablet, Take 90 mg by mouth 2 (two) times daily., Disp: , Rfl:  .  venlafaxine XR (EFFEXOR-XR) 75 MG 24 hr capsule, Take 75 mg by mouth 2 (two) times daily. , Disp: , Rfl:   Past Medical History: Past Medical History:  Diagnosis Date  . Asthma 01/06/2017  . Coronary artery disease   . Essential hypertension 01/06/2017  . Fibromyalgia   . Pleuritic chest pain 01/06/2017  . Shortness of breath 01/06/2017    Tobacco Use: Social History   Tobacco Use  Smoking Status Former  Smoker  Smokeless Tobacco Never Used    Labs: Recent Review [facility] [employer] [facility] Latest Ref Rng & Units 08/17/2017   Cholestrol 0 - 200 mg/dL 588(T)   LDLCALC 0 - 99 mg/dL 254(D)   HDL >82 mg/dL 64(B)   Trlycerides <583 mg/dL 094(M)   Hemoglobin H6K 4.8 - 5.6 % 7.6(H)      Capillary Blood Glucose: Lab Results  Component Value Date   GLUCAP 105 (H) 08/19/2017   GLUCAP 143 (H) 08/19/2017   GLUCAP 170 (H) 08/18/2017   GLUCAP 103 (H) 08/18/2017   GLUCAP 115 (H) 08/18/2017     Exercise Target Goals: Date: 10/02/17  Exercise Program Goal: Individual exercise prescription set with THRR, safety & activity barriers. Participant demonstrates ability to understand and report RPE using BORG scale, to self-measure pulse accurately, and to acknowledge the importance of the exercise prescription.  Exercise Prescription Goal: Starting with aerobic activity 30 plus minutes a day, 3 days per week for initial exercise prescription. Provide home exercise prescription and guidelines that participant acknowledges understanding prior to discharge.  Activity Barriers & Risk Stratification: Activity Barriers & Cardiac Risk Stratification - 10/02/17 1516      Activity Barriers & Cardiac Risk Stratification   Activity Barriers  Arthritis;Deconditioning;Muscular Weakness;Fibromyalgia       6 Minute Walk: 6 Minute Walk    Row Name 10/02/17 1426 10/02/17  1458 10/02/17 1509     6 Minute Walk   Phase  Initial  -  -   Distance  1036 feet  -  1136 feet   Walk Time  6 minutes  -  -   # of Rest Breaks  0  -  -   MPH  1.96  -  2.2   METS  2.04  -  2.3   RPE  11  -  -   Perceived Dyspnea   1  -  -   VO2 Peak  7.14  -  7.88   Symptoms  Yes (comment)  -  -   Comments  mild SOB  -  -   Resting HR  66 bpm  -  -   Resting BP  108/80  -  -   Resting Oxygen Saturation   98 %  -  -   Exercise Oxygen Saturation  during 6 min walk  97 %  -  -   Max Ex. HR  92 bpm   96 bpm  -   Max Ex. BP  128/78  -  -   2 Minute Post BP  -  -  110/68      Oxygen Initial Assessment:   Oxygen Re-Evaluation:   Oxygen Discharge (Final Oxygen Re-Evaluation):   Initial Exercise Prescription: Initial Exercise Prescription - 10/02/17 1400      Date of Initial Exercise RX and Referring Provider   Date  10/02/17    Referring Provider  Chilton Si MD      NuStep   Level  2    SPM  70    Minutes  10    METs  1.5      Arm Ergometer   Level  2    Minutes  10    METs  1.5      Track   Laps  6    Minutes  10    METs  2.03      Prescription Details   Frequency (times per week)  3    Duration  Progress to 30 minutes of continuous aerobic without signs/symptoms of physical distress      Intensity   THRR 40-80% of Max Heartrate  61-122    Ratings of Perceived Exertion  11-15    Perceived Dyspnea  0-4      Progression   Progression  Continue to progress workloads to maintain intensity without signs/symptoms of physical distress.      Resistance Training   Training Prescription  Yes    Weight  2lbs    Reps  10-15       Perform Capillary Blood Glucose checks as needed.  Exercise Prescription Changes:   Exercise Comments:   Exercise Goals and Review:  Exercise Goals    Row Name 10/02/17 1535             Exercise Goals   Increase Physical Activity  Yes       Intervention  Provide advice, education, support and counseling about physical activity/exercise needs.;Develop an individualized exercise prescription for aerobic and resistive training based on initial evaluation findings, risk stratification, comorbidities and participant's personal goals.       Expected Outcomes  Achievement of increased cardiorespiratory fitness and enhanced flexibility, muscular endurance and strength shown through measurements of functional capacity and personal statement of participant.       Increase Strength and Stamina  Yes       Intervention  Provide  advice, education, support and counseling about physical activity/exercise needs.;Develop an individualized exercise prescription for aerobic and resistive training based on initial evaluation findings, risk stratification, comorbidities and participant's personal goals.       Expected Outcomes  Achievement of increased cardiorespiratory fitness and enhanced flexibility, muscular endurance and strength shown through measurements of functional capacity and personal statement of participant.       Able to understand and use rate of perceived exertion (RPE) scale  Yes       Intervention  Provide education and explanation on how to use RPE scale       Expected Outcomes  Short Term: Able to use RPE daily in rehab to express subjective intensity level;Long Term:  Able to use RPE to guide intensity level when exercising independently       Knowledge and understanding of Target Heart Rate Range (THRR)  Yes       Intervention  Provide education and explanation of THRR including how the numbers were predicted and where they are located for reference       Expected Outcomes  Short Term: Able to state/look up THRR;Short Term: Able to use daily as guideline for intensity in rehab;Long Term: Able to use THRR to govern intensity when exercising independently       Able to check pulse independently  Yes       Intervention  Provide education and demonstration on how to check pulse in carotid and radial arteries.;Review the importance of being able to check your own pulse for safety during independent exercise       Expected Outcomes  Long Term: Able to check pulse independently and accurately;Short Term: Able to explain why pulse checking is important during independent exercise       Understanding of Exercise Prescription  Yes       Intervention  Provide education, explanation, and written materials on patient's individual exercise prescription       Expected Outcomes  Short Term: Able to explain program exercise  prescription;Long Term: Able to explain home exercise prescription to exercise independently          Exercise Goals Re-Evaluation :    Discharge Exercise Prescription (Final Exercise Prescription Changes):   Nutrition:  Target Goals: Understanding of nutrition guidelines, daily intake of sodium 1500mg , cholesterol 200mg , calories 30% from fat and 7% or less from saturated fats, daily to have 5 or more servings of fruits and vegetables.  Biometrics: Pre Biometrics - 10/02/17 1607      Pre Biometrics   Height  5\' 5"  (1.651 m)    Weight  212 lb 8.4 oz (96.4 kg)    Waist Circumference  37.5 inches    Hip Circumference  47 inches    Waist to Hip Ratio  0.8 %    BMI (Calculated)  35.37    Triceps Skinfold  48 mm    % Body Fat  46.5 %    Grip Strength  21 kg    Flexibility  12 in    Single Leg Stand  3.56 seconds        Nutrition Therapy Plan and Nutrition Goals: Nutrition Therapy & Goals - 10/02/17 1527      Nutrition Therapy   Diet  Carb Modified, Heart Healthy      Personal Nutrition Goals   Nutrition Goal  Pt to identify food quantities necessary to achieve weight loss of 6-24 lb (2.7-10.9 kg) at graduation from cardiac rehab. Goal wt of less than  200 lb desired.     Personal Goal #2  Improved blood glucose control as evidenced by pt's A1c trending from 7.6 toward less than 7.0.    Personal Goal #3  Pt able to name foods that affect blood glucose       Intervention Plan   Intervention  Prescribe, educate and counsel regarding individualized specific dietary modifications aiming towards targeted core components such as weight, hypertension, lipid management, diabetes, heart failure and other comorbidities.    Expected Outcomes  Short Term Goal: Understand basic principles of dietary content, such as calories, fat, sodium, cholesterol and nutrients.;Long Term Goal: Adherence to prescribed nutrition plan.       Nutrition Discharge: Nutrition Scores:   Nutrition Goals  Re-Evaluation:   Nutrition Goals Re-Evaluation:   Nutrition Goals Discharge (Final Nutrition Goals Re-Evaluation):   Psychosocial: Target Goals: Acknowledge presence or absence of significant depression and/or stress, maximize coping skills, provide positive support system. Participant is able to verbalize types and ability to use techniques and skills needed for reducing stress and depression.  Initial Review & Psychosocial Screening: Initial Psych Review & Screening - 10/02/17 1551      Initial Review   Current issues with  None Identified      Family Dynamics   Good Support System?  Yes Mia Gonzales has her sister and son for support      Barriers   Psychosocial barriers to participate in program  There are no identifiable barriers or psychosocial needs.      Screening Interventions   Interventions  Encouraged to exercise       Quality of Life Scores:   PHQ-9: Recent Review Flowsheet Data    There is no flowsheet data to display.     Interpretation of Total Score  Total Score Depression Severity:  1-4 = Minimal depression, 5-9 = Mild depression, 10-14 = Moderate depression, 15-19 = Moderately severe depression, 20-27 = Severe depression   Psychosocial Evaluation and Intervention:   Psychosocial Re-Evaluation:   Psychosocial Discharge (Final Psychosocial Re-Evaluation):   Vocational Rehabilitation: Provide vocational rehab assistance to qualifying candidates.   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 Participant will state understanding/return demonstration of topics presented.  Learning  Barriers/Preferences: Learning Barriers/Preferences - 10/02/17 1535      Learning Barriers/Preferences   Learning Preferences  Individual Instruction;Group Instruction;Pictoral;Skilled Demonstration       Education Topics: Count Your Pulse:  -Group instruction provided by verbal instruction, demonstration, patient participation and written materials to support subject.  Instructors address importance of being able to find your pulse and how to count your pulse when at home without a heart monitor.  Patients get hands on experience counting their pulse with staff help and individually.   Heart Attack, Angina, and Risk Factor Modification:  -Group instruction provided by verbal instruction, video, and written materials to support subject.  Instructors address signs and symptoms of angina and heart attacks.    Also discuss risk factors for heart disease and how to make changes to improve heart health risk factors.   Functional Fitness:  -Group instruction provided by verbal instruction, demonstration, patient participation, and written materials to  support subject.  Instructors address safety measures for doing things around the house.  Discuss how to get up and down off the floor, how to pick things up properly, how to safely get out of a chair without assistance, and balance training.   Meditation and Mindfulness:  -Group instruction provided by verbal instruction, patient participation, and written materials to support subject.  Instructor addresses importance of mindfulness and meditation practice to help reduce stress and improve awareness.  Instructor also leads participants through a meditation exercise.    Stretching for Flexibility and Mobility:  -Group instruction provided by verbal instruction, patient participation, and written materials to support subject.  Instructors lead participants through series of stretches that are designed to increase flexibility thus improving mobility.   These stretches are additional exercise for major muscle groups that are typically performed during regular warm up and cool down.   Hands Only CPR:  -Group verbal, video, and participation provides a basic overview of AHA guidelines for community CPR. Role-play of emergencies allow participants the opportunity to practice calling for help and chest compression technique with discussion of AED use.   Hypertension: -Group verbal and written instruction that provides a basic overview of hypertension including the most recent diagnostic guidelines, risk factor reduction with self-care instructions and medication management.    Nutrition I class: Heart Healthy Eating:  -Group instruction provided by PowerPoint slides, verbal discussion, and written materials to support subject matter. The instructor gives an explanation and review of the Therapeutic Lifestyle Changes diet recommendations, which includes a discussion on lipid goals, dietary fat, sodium, fiber, plant stanol/sterol esters, sugar, and the components of a well-balanced, healthy diet.   CARDIAC REHAB PHASE II ORIENTATION from 10/02/2017 in [facility] CARDIAC REHAB  Date  09/25/17  Educator  RD  Instruction Review Code  Not applicable      Nutrition II class: Lifestyle Skills:  -Group instruction provided by PowerPoint slides, verbal discussion, and written materials to support subject matter. The instructor gives an explanation and review of label reading, grocery shopping for heart health, heart healthy recipe modifications, and ways to make healthier choices when eating out.   CARDIAC REHAB PHASE II ORIENTATION from 10/02/2017 in [facility] CARDIAC REHAB  Date  10/02/17  Educator  RD  Instruction Review Code  Not applicable      Diabetes Question & Answer:  -Group instruction provided by PowerPoint slides, verbal discussion, and written materials to support subject matter. The instructor gives  an explanation and review of diabetes co-morbidities, pre- and post-prandial blood glucose goals, pre-exercise blood glucose goals, signs, symptoms, and treatment of hypoglycemia and hyperglycemia, and foot care basics.   Diabetes Blitz:  -Group instruction provided by PowerPoint slides, verbal discussion, and written materials to support subject matter. The instructor gives an explanation and review of the physiology behind type 1 and type 2 diabetes, diabetes medications and rational behind using different medications, pre- and post-prandial blood glucose recommendations and Hemoglobin A1c goals, diabetes diet, and exercise including blood glucose guidelines for exercising safely.    Portion Distortion:  -Group instruction provided by PowerPoint slides, verbal discussion, written materials, and food models to support subject matter. The instructor gives an explanation of serving size versus portion size, changes in portions sizes over the last 20 years, and what consists of a serving from each food group.   Stress Management:  -Group instruction provided by verbal instruction, video, and written materials to support subject matter.  Instructors review role  of stress in heart disease and how to cope with stress positively.     Exercising on Your Own:  -Group instruction provided by verbal instruction, power point, and written materials to support subject.  Instructors discuss benefits of exercise, components of exercise, frequency and intensity of exercise, and end points for exercise.  Also discuss use of nitroglycerin and activating EMS.  Review options of places to exercise outside of rehab.  Review guidelines for sex with heart disease.   Cardiac Drugs I:  -Group instruction provided by verbal instruction and written materials to support subject.  Instructor reviews cardiac drug classes: antiplatelets, anticoagulants, beta blockers, and statins.  Instructor discusses reasons, side effects,  and lifestyle considerations for each drug class.   Cardiac Drugs II:  -Group instruction provided by verbal instruction and written materials to support subject.  Instructor reviews cardiac drug classes: angiotensin converting enzyme inhibitors (ACE-I), angiotensin II receptor blockers (ARBs), nitrates, and calcium channel blockers.  Instructor discusses reasons, side effects, and lifestyle considerations for each drug class.   Anatomy and Physiology of the Circulatory System:  Group verbal and written instruction and models provide basic cardiac anatomy and physiology, with the coronary electrical and arterial systems. Review of: AMI, Angina, Valve disease, Heart Failure, Peripheral Artery Disease, Cardiac Arrhythmia, Pacemakers, and the ICD.   Other Education:  -Group or individual verbal, written, or video instructions that support the educational goals of the cardiac rehab program.   Knowledge Questionnaire Score: Knowledge Questionnaire Score - 10/02/17 1528      Knowledge Questionnaire Score   Pre Score  23/24       Core Components/Risk Factors/Patient Goals at Admission: Personal Goals and Risk Factors at Admission - 10/02/17 1417      Core Components/Risk Factors/Patient Goals on Admission    Weight Management  Yes    Intervention  Weight Management: Develop a combined nutrition and exercise program designed to reach desired caloric intake, while maintaining appropriate intake of nutrient and fiber, sodium and fats, and appropriate energy expenditure required for the weight goal.;Weight Management: Provide education and appropriate resources to help participant work on and attain dietary goals.;Weight Management/Obesity: Establish reasonable short term and long term weight goals.;Obesity: Provide education and appropriate resources to help participant work on and attain dietary goals.    Expected Outcomes  Short Term: Continue to assess and modify interventions until short term  weight is achieved;Long Term: Adherence to nutrition and physical activity/exercise program aimed toward attainment of established weight goal;Weight Maintenance: Understanding of the daily nutrition guidelines, which includes 25-35% calories from fat, 7% or less cal from saturated fats, less than 200mg  cholesterol, less than 1.5gm of sodium, & 5 or more servings of fruits and vegetables daily;Weight Loss: Understanding of general recommendations for a balanced deficit meal plan, which promotes 1-2 lb weight loss per week and includes a negative energy balance of [phone] kcal/d;Understanding recommendations for meals to include 15-35% energy as protein, 25-35% energy from fat, 35-60% energy from carbohydrates, less than 200mg  of dietary cholesterol, 20-35 gm of total fiber daily;Understanding of distribution of calorie intake throughout the day with the consumption of 4-5 meals/snacks    Diabetes  Yes    Intervention  Provide education about signs/symptoms and action to take for hypo/hyperglycemia.;Provide education about proper nutrition, including hydration, and aerobic/resistive exercise prescription along with prescribed medications to achieve blood glucose in normal ranges: Fasting glucose 65-99 mg/dL    Expected Outcomes  Short Term: Participant verbalizes understanding of the signs/symptoms and immediate care  of hyper/hypoglycemia, proper foot care and importance of medication, aerobic/resistive exercise and nutrition plan for blood glucose control.;Long Term: Attainment of HbA1C < 7%.    Lipids  Yes    Intervention  Provide education and support for participant on nutrition & aerobic/resistive exercise along with prescribed medications to achieve LDL 70mg , HDL >40mg .    Expected Outcomes  Short Term: Participant states understanding of desired cholesterol values and is compliant with medications prescribed. Participant is following exercise prescription and nutrition guidelines.;Long Term:  Cholesterol controlled with medications as prescribed, with individualized exercise RX and with personalized nutrition plan. Value goals: LDL < 70mg , HDL > 40 mg.       Core Components/Risk Factors/Patient Goals Review:    Core Components/Risk Factors/Patient Goals at Discharge (Final Review):    ITP Comments: ITP Comments    Row Name 10/02/17 1413           ITP Comments  Dr Armanda Magic, Medical Director          Comments: Mia Gonzales attended orientation from 1340 to 1448 to review rules and guidelines for program. Completed 6 minute walk test, Intitial ITP, and exercise prescription.  VSS. Telemetry-Sinus . Mia Gonzales did report experiencing some shortness of breath that resolved with rest. I talked with Mia Gonzales at Dr Leonides Sake office regarding Mia Gonzales complaints of fatigue. Mia Gonzales made an appointment for Mia Gonzales to follow up with Bailey Mech DNP next Wednesday at 11:00 am. Mia Gonzales will hold off on exercising at cardiac rehab until she is seen at Dr Leonides Sake office next week. 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   Gladstone Lighter, RN,BSN 10/02/2017 4:28 PM

## 2017-10-02 NOTE — Telephone Encounter (Signed)
S/w Mia Gonzales @ cardiac Rehab she states that pt is there for her appt and states that she had L heart cath w/stent she has been SOB and fatigued. Mia Gonzales states that she wanted to call because she does not think that pt should do her rehab in this condition. Scheduled appt w/Mia Gonzales 10-08-17 @11am  to discuss/evaluated. Mia Gonzales will give pt ER precautions until then.

## 2017-10-02 NOTE — Progress Notes (Signed)
Mia Gonzales 67 y.o. female DOB: July 28, 1951 MRN: [id number]      Nutrition Note  1. NSTEMI (non-ST elevated myocardial infarction) (HCC) 08/16/17   2. S/P coronary artery stent placement 08/18/17 MID RCA    Past Medical History:  Diagnosis Date  . Asthma 01/06/2017  . Coronary artery disease   . Essential hypertension 01/06/2017  . Fibromyalgia   . Pleuritic chest pain 01/06/2017  . Shortness of breath 01/06/2017   Meds reviewed.  HT: Ht Readings from Last 1 Encounters:  10/02/17 5\' 5"  (1.651 m)    WT: Wt Readings from Last 3 Encounters:  10/02/17 212 lb 8.4 oz (96.4 kg)  08/26/17 207 lb 3.2 oz (94 kg)  08/19/17 213 lb 13.5 oz (97 kg)     BMI 35.4   Current tobacco use? No   Labs:  Lipid Panel     Component Value Date/Time   CHOL 205 (H) 08/17/2017 0015   TRIG 267 (H) 08/17/2017 0015   HDL 31 (L) 08/17/2017 0015   CHOLHDL 6.6 08/17/2017 0015   VLDL 53 (H) 08/17/2017 0015   LDLCALC 121 (H) 08/17/2017 0015    Lab Results  Component Value Date   HGBA1C 7.6 (H) 08/17/2017   CBG (last 3)  No results for input(s): GLUCAP in the last 72 hours.  Nutrition Note Spoke with pt. Nutrition plan and goals reviewed with pt. Pt wants to lose wt. Pt has been trying to lose wt by eating healthier and moving more. Wt loss tips reviewed.  Pt is diabetic according to EMR. Per pt,she has not been told she is DM just pre-diabetic. Pt has a family h/o DM and is familiar with some DM terms (e.g. A1c). Pt expressed understanding of the information reviewed. Pt aware of nutrition education classes offered and is unable to attend nutrition classes due to work schedule.  Nutrition Diagnosis ? Food-and nutrition-related knowledge deficit related to lack of exposure to information as related to diagnosis of: ? CVD ? DM ? Obesity related to excessive energy intake as evidenced by a BMI of 35.4  Nutrition Intervention ? Pt's individual nutrition plan and goals reviewed with pt. ? Pt given handouts  for: ? Nutrition I class ? Nutrition II class   Nutrition Goal(s):  ? Pt to identify food quantities necessary to achieve weight loss of 6-24 lb (2.7-10.9 kg) at graduation from cardiac rehab. Goal wt of less than 200 lb desired.  ? Improved blood glucose control as evidenced by pt's A1c trending from 7.6 toward less than 7.0. ? Pt able to name foods that affect blood glucose   Plan:  Pt to attend nutrition classes ? Nutrition I ? Nutrition II ? Portion Distortion  Will provide client-centered nutrition education as part of interdisciplinary care.   Monitor and evaluate progress toward nutrition goal with team.  Mickle Plumb, M.Ed, RD, LDN, CDE 10/02/2017 3:19 PM

## 2017-10-06 ENCOUNTER — Ambulatory Visit (HOSPITAL_COMMUNITY): Payer: Medicare HMO

## 2017-10-08 ENCOUNTER — Ambulatory Visit (HOSPITAL_COMMUNITY): Payer: Medicare HMO

## 2017-10-08 ENCOUNTER — Ambulatory Visit: Payer: Medicare HMO | Admitting: Adult Health

## 2017-10-09 ENCOUNTER — Telehealth: Payer: Self-pay | Admitting: Adult Health

## 2017-10-09 ENCOUNTER — Encounter: Payer: Self-pay | Admitting: Adult Health

## 2017-10-09 ENCOUNTER — Ambulatory Visit: Payer: Medicare HMO | Admitting: Adult Health

## 2017-10-09 VITALS — BP 118/78 | HR 68 | Ht 67.0 in | Wt 211.8 lb

## 2017-10-09 DIAGNOSIS — I2510 Atherosclerotic heart disease of native coronary artery without angina pectoris: Secondary | ICD-10-CM | POA: Diagnosis not present

## 2017-10-09 DIAGNOSIS — R0602 Shortness of breath: Secondary | ICD-10-CM

## 2017-10-09 MED ORDER — CLOPIDOGREL BISULFATE 75 MG PO TABS: 75.0000 mg | ORAL_TABLET | Freq: Every day | ORAL | 3 refills | Status: DC

## 2017-10-09 MED ORDER — COQ10 100 MG PO CAPS: 1.0000 | ORAL_CAPSULE | Freq: Every day | ORAL | Status: AC

## 2017-10-09 NOTE — Progress Notes (Signed)
Cardiology Office Note   Date:  10/09/2017   ID:  Mia Gonzales, DOB 01/31/1951, MRN [id number]  PCP:  Gonzales, Karla, MD  Cardiologist: Dr.Iberia   Chief Complaint  Patient presents with  . Shortness of Breath    pt states some SOB with exertion      History of Present Illness: Mia Gonzales is a 67 y.o. female who presents for ongoing assessment and management of coronary artery disease, status post drug-eluting stent to the mid RCA in November 2018 with nonobstructive disease elsewhere within the coronary tree, hyperlipidemia, history of prior tobacco abuse, history of right renal carcinoma status post radical nephrectomy, and diabetes.  She was last seen by Dr. Spicer on 08/26/2017 with complaints of exertional dyspnea.    The patient was continued on dual antiplatelet therapy plan to keep on this regimen for a minimum of 1 year.  She was advised to become enrolled in a patient assistant program to assist her with Brilinta.  Follow-up labs were ordered to include a lipid panel, BMET, she was referred to cardiac rehab.  She has not been able to participate in cardiac rehab due to dyspnea and weakness. She states she is tired all of the time. Comes home from work and is completely exhausted. This has been becoming very frustrating for her. She denies bleeding or melena.   Past Medical History:  Diagnosis Date  . Asthma 01/06/2017  . Coronary artery disease   . Essential hypertension 01/06/2017  . Fibromyalgia   . Pleuritic chest pain 01/06/2017  . Shortness of breath 01/06/2017    Past Surgical History:  Procedure Laterality Date  . CARDIAC CATHETERIZATION  08/18/2017   S/P DES RCA  Dr Varanasi  . CHOLECYSTECTOMY    . CORONARY STENT INTERVENTION N/A 08/18/2017   Procedure: CORONARY STENT INTERVENTION;  Surgeon: Varanasi, Jayadeep S, MD;  Location: [facility]ASIVE CV [facility];  Service: Cardiovascular;  Laterality: N/A;  . LEFT HEART CATH AND CORONARY ANGIOGRAPHY N/A 08/18/2017   Procedure:  LEFT HEART CATH AND CORONARY ANGIOGRAPHY;  Surgeon: Varanasi, Jayadeep S, MD;  Location: [facility]VAS[facility] CV [facility];  Service: Cardiovascular;  Laterality: N/A;  . NEPHRECTOMY       Current Outpatient Medications  Medication Sig Dispense Refill  . atorvastatin (LIPITOR) 40 MG tablet Take 1 tablet (40 mg total) by mouth daily at 6 PM. 90 tablet 1  . Cholecalciferol (VITAMIN D3) 10000 units TABS Take 5,000 Units by mouth daily.    . gabapentin (NEURONTIN) 300 MG capsule Take 300 mg by mouth 2 (two) times daily.     . metoprolol succinate (TOPROL-XL) 25 MG 24 hr tablet Take 1 tablet (25 mg total) by mouth daily. 90 tablet 1  . nitroGLYCERIN (NITROSTAT) 0.4 MG SL tablet Place 1 tablet (0.4 mg total) under the tongue every 5 (five) minutes as needed for chest pain. 25 tablet 12  . venlafaxine XR (EFFEXOR-XR) 75 MG 24 hr capsule Take 75 mg by mouth 2 (two) times daily.      No current facility-administered medications for this visit.     Allergies:   Povidone-iodine; Tramadol-acetaminophen; Codeine; and Fentanyl    Social History:  The patient  reports that she has quit smoking. she has never used smokeless tobacco. She reports that she does not drink alcohol or use drugs.   Family History:  The patient's family history includes CAD in her sister; Cancer in her father and mother; Cardiomyopathy in her sister; Diabetes in her father and paternal grandmother; Heart attack   in her maternal grandfather and sister; Heart disease in her maternal grandfather and maternal grandmother; Rheum arthritis in her mother.    ROS: All other systems are reviewed and negative. Unless otherwise mentioned in H&P    PHYSICAL EXAM: VS:  BP 118/78   Pulse 68   Ht 5' 7" (1.702 m)   Wt 211 lb 12.8 oz (96.1 kg)   BMI 33.17 kg/m  , BMI Body mass index is 33.17 kg/m. GEN: Well nourished, well developed, in no acute distress  HEENT: normal  Neck: no JVD, carotid bruits, or masses Cardiac: RRR; no murmurs, rubs, or  gallops,no edema  Respiratory:  clear to auscultation bilaterally, normal work of breathing GI: soft, nontender, nondistended, + BS MS: no deformity or atrophy  Skin: warm and dry, no rash Neuro:  Strength and sensation are intact Psych: euthymic mood, full affect      Recent Labs: 08/17/2017: ALT 18 08/19/2017: BUN 21; Creatinine, Ser 1.12; Hemoglobin 12.7; Platelets 211; Potassium 4.1; Sodium 138    Lipid Panel    Component Value Date/Time   CHOL 205 (H) 08/17/2017 0015   TRIG 267 (H) 08/17/2017 0015   HDL 31 (L) 08/17/2017 0015   CHOLHDL 6.6 08/17/2017 0015   VLDL 53 (H) 08/17/2017 0015   LDLCALC 121 (H) 08/17/2017 0015      Wt Readings from Last 3 Encounters:  10/09/17 211 lb 12.8 oz (96.1 kg)  10/02/17 212 lb 8.4 oz (96.4 kg)  08/26/17 207 lb 3.2 oz (94 kg)      Other studies Reviewed: Cardiac Cath 08/18/2017 Conclusion     Mid RCA lesion is 99% stenosed. A drug-eluting stent was successfully placed using a STENT SIERRA 3.00 X 38 MM, postdilated to > 3.5 mm.  Post intervention, there is a 0% residual stenosis.  1st Mrg lesion is 25% stenosed.  Mid Cx lesion is 25% stenosed.  Mid LAD lesion is 20% stenosed.  The left ventricular ejection fraction is 55-65% by visual estimate.  LV end diastolic pressure is mildly elevated. LVEDP 21 mm Hg.  The left ventricular systolic function is normal.  There is no aortic valve stenosis.   Nonobstructive disease in the left system.  Continue dual antiplatelet therapy for one year with aggressive secondary prevention.      Echocardiogram Left ventricle: The cavity size was normal. Wall thickness was   increased in a pattern of moderate LVH. Systolic function was   vigorous. The estimated ejection fraction was in the range of 65%   to 70%. Wall motion was normal; there were no regional wall   motion abnormalities. Doppler parameters are consistent with   abnormal left ventricular relaxation (grade 1  diastolic   dysfunction). The E/e&' ratio is between 8-15, suggesting   indeterminate LV filling pressure. - Aortic valve: Trileaflet. Sclerosis without stenosis. There was   no regurgitation. - Mitral valve: Mildly thickened leaflets . There was trivial   regurgitation. - Left atrium: The atrium was normal in size. - Inferior vena cava: The vessel was normal in size. The   respirophasic diameter changes were in the normal range (>= 50%),   consistent with normal central venous pressure.  ASSESSMENT AND PLAN:  1. CAD: S/P DES to the mid RCA. She has been on DAPT with Brilinta and ASA. She has been having worsening dyspnea. She has been medically compliant. She has not been able to participate in cardiac rehab due to fatigue and dyspnea.   I will stop the Brilinta and   begin Plavix. She will be loaded with 300 mg once and then start 75 mg daily. She will change her metoprolol to HS dose from AM dose. She will call us in one week to let us know how she is feeling with these changes.   Will see her back in one month. If she is better, she will start back on Phase II cardiac rehab.   2. Hx of Asthma: If she is not feeling better, may have to refer to pulmonology for further recommendations.   Current medicines are reviewed at length with the patient today.    Labs/ tests ordered today include: None  Davine Sweney M. Lai Hendriks DNP, ANP, AACC   10/09/2017 2:37 PM    [facility] [address], [place], [place] [address] Phone: [phone]; Fax: [phone] 

## 2017-10-09 NOTE — H&P (View-Only) (Signed)
Cardiology Office Note   Date:  10/09/2017   ID:  Mia Gonzales, DOB 06-Jul-1951, MRN [id number]  PCP:  Caffie Damme, MD  Cardiologist: Dr.[place]   Chief Complaint  Patient presents with  . Shortness of Breath    pt states some SOB with exertion      History of Present Illness: Mia Gonzales is a 67 y.o. female who presents for ongoing assessment and management of coronary artery disease, status post drug-eluting stent to the mid RCA in November 2018 with nonobstructive disease elsewhere within the coronary tree, hyperlipidemia, history of prior tobacco abuse, history of right renal carcinoma status post radical nephrectomy, and diabetes.  She was last seen by Dr. Duke Salvia on 08/26/2017 with complaints of exertional dyspnea.    The patient was continued on dual antiplatelet therapy plan to keep on this regimen for a minimum of 1 year.  She was advised to become enrolled in a patient assistant program to assist her with Brilinta.  Follow-up labs were ordered to include a lipid panel, BMET, she was referred to cardiac rehab.  She has not been able to participate in cardiac rehab due to dyspnea and weakness. She states she is tired all of the time. Comes home from work and is completely exhausted. This has been becoming very frustrating for her. She denies bleeding or melena.   Past Medical History:  Diagnosis Date  . Asthma 01/06/2017  . Coronary artery disease   . Essential hypertension 01/06/2017  . Fibromyalgia   . Pleuritic chest pain 01/06/2017  . Shortness of breath 01/06/2017    Past Surgical History:  Procedure Laterality Date  . CARDIAC CATHETERIZATION  08/18/2017   S/P DES RCA  Dr Eldridge Dace  . CHOLECYSTECTOMY    . CORONARY STENT INTERVENTION N/A 08/18/2017   Procedure: CORONARY STENT INTERVENTION;  Surgeon: Corky Crafts, MD;  Location: [facility] INVASIVE CV LAB;  Service: Cardiovascular;  Laterality: N/A;  . LEFT HEART CATH AND CORONARY ANGIOGRAPHY N/A 08/18/2017   Procedure:  LEFT HEART CATH AND CORONARY ANGIOGRAPHY;  Surgeon: Corky Crafts, MD;  Location: [facility] INVASIVE CV LAB;  Service: Cardiovascular;  Laterality: N/A;  . NEPHRECTOMY       Current Outpatient Medications  Medication Sig Dispense Refill  . atorvastatin (LIPITOR) 40 MG tablet Take 1 tablet (40 mg total) by mouth daily at 6 PM. 90 tablet 1  . Cholecalciferol (VITAMIN D3) 10000 units TABS Take 5,000 Units by mouth daily.    Marland Kitchen gabapentin (NEURONTIN) 300 MG capsule Take 300 mg by mouth 2 (two) times daily.     . metoprolol succinate (TOPROL-XL) 25 MG 24 hr tablet Take 1 tablet (25 mg total) by mouth daily. 90 tablet 1  . nitroGLYCERIN (NITROSTAT) 0.4 MG SL tablet Place 1 tablet (0.4 mg total) under the tongue every 5 (five) minutes as needed for chest pain. 25 tablet 12  . venlafaxine XR (EFFEXOR-XR) 75 MG 24 hr capsule Take 75 mg by mouth 2 (two) times daily.      No current facility-administered medications for this visit.     Allergies:   Povidone-iodine; Tramadol-acetaminophen; Codeine; and Fentanyl    Social History:  The patient  reports that she has quit smoking. she has never used smokeless tobacco. She reports that she does not drink alcohol or use drugs.   Family History:  The patient's family history includes CAD in her sister; Cancer in her father and mother; Cardiomyopathy in her sister; Diabetes in her father and paternal grandmother; Heart attack  in her maternal grandfather and sister; Heart disease in her maternal grandfather and maternal grandmother; Rheum arthritis in her mother.    ROS: All other systems are reviewed and negative. Unless otherwise mentioned in H&P    PHYSICAL EXAM: VS:  BP 118/78   Pulse 68   Ht 5\' 7"  (1.702 m)   Wt 211 lb 12.8 oz (96.1 kg)   BMI 33.17 kg/m  , BMI Body mass index is 33.17 kg/m. GEN: Well nourished, well developed, in no acute distress  HEENT: normal  Neck: no JVD, carotid bruits, or masses Cardiac: RRR; no murmurs, rubs, or  gallops,no edema  Respiratory:  clear to auscultation bilaterally, normal work of breathing GI: soft, nontender, nondistended, + BS MS: no deformity or atrophy  Skin: warm and dry, no rash Neuro:  Strength and sensation are intact Psych: euthymic mood, full affect      Recent Labs: 08/17/2017: ALT 18 08/19/2017: BUN 21; Creatinine, Ser 1.12; Hemoglobin 12.7; Platelets 211; Potassium 4.1; Sodium 138    Lipid Panel    Component Value Date/Time   CHOL 205 (H) 08/17/2017 0015   TRIG 267 (H) 08/17/2017 0015   HDL 31 (L) 08/17/2017 0015   CHOLHDL 6.6 08/17/2017 0015   VLDL 53 (H) 08/17/2017 0015   LDLCALC 121 (H) 08/17/2017 0015      Wt Readings from Last 3 Encounters:  10/09/17 211 lb 12.8 oz (96.1 kg)  10/02/17 212 lb 8.4 oz (96.4 kg)  08/26/17 207 lb 3.2 oz (94 kg)      Other studies Reviewed: Cardiac Cath 08/18/2017 Conclusion     Mid RCA lesion is 99% stenosed. A drug-eluting stent was successfully placed using a STENT SIERRA 3.00 X 38 MM, postdilated to > 3.5 mm.  Post intervention, there is a 0% residual stenosis.  1st Mrg lesion is 25% stenosed.  Mid Cx lesion is 25% stenosed.  Mid LAD lesion is 20% stenosed.  The left ventricular ejection fraction is 55-65% by visual estimate.  LV end diastolic pressure is mildly elevated. LVEDP 21 mm Hg.  The left ventricular systolic function is normal.  There is no aortic valve stenosis.   Nonobstructive disease in the left system.  Continue dual antiplatelet therapy for one year with aggressive secondary prevention.      Echocardiogram Left ventricle: The cavity size was normal. Wall thickness was   increased in a pattern of moderate LVH. Systolic function was   vigorous. The estimated ejection fraction was in the range of 65%   to 70%. Wall motion was normal; there were no regional wall   motion abnormalities. Doppler parameters are consistent with   abnormal left ventricular relaxation (grade 1  diastolic   dysfunction). The E/e&' ratio is between 8-15, suggesting   indeterminate LV filling pressure. - Aortic valve: Trileaflet. Sclerosis without stenosis. There was   no regurgitation. - Mitral valve: Mildly thickened leaflets . There was trivial   regurgitation. - Left atrium: The atrium was normal in size. - Inferior vena cava: The vessel was normal in size. The   respirophasic diameter changes were in the normal range (>= 50%),   consistent with normal central venous pressure.  ASSESSMENT AND PLAN:  1. CAD: S/P DES to the mid RCA. She has been on DAPT with Brilinta and ASA. She has been having worsening dyspnea. She has been medically compliant. She has not been able to participate in cardiac rehab due to fatigue and dyspnea.   I will stop the Brilinta and  begin Plavix. She will be loaded with 300 mg once and then start 75 mg daily. She will change her metoprolol to HS dose from AM dose. She will call us in one week to let us know how she is feeling with these changes.   Will see her back in one month. If she is better, she will start back on Phase II cardiac rehab.   2. Hx of Asthma: If she is not feeling better, may have to refer to pulmonology for further recommendations.   Current medicines are reviewed at length with the patient today.    Labs/ tests ordered today include: None  Bettey Mare. Liborio Nixon, ANP, AACC   10/09/2017 2:37 PM    [facility] [address] [place], [place] [address] Phone: [phone]; Fax: [phone]

## 2017-10-09 NOTE — Telephone Encounter (Signed)
Cancel error

## 2017-10-09 NOTE — Patient Instructions (Signed)
Medication Instructions:  TAKE BRILINTA TONIGHT THEN STOP  START PLAVIX START Friday TAKE 300MG  (4 TABS-75MG ) THEN 1 TAB-75MG  THEREAFTER STARTING Saturday  START TAKING METOPROLOL AT NIGHT  If you need a refill on your cardiac medications before your next appointment, please call your pharmacy.  Special Instructions: CALL NEXT WEEK AND DISCUSS HOW MEDICATIONS ARE WORKING  HOLD REHAB FOR NOW  Follow-Up: Your physician wants you to follow-up in: 1 MONTH WITH DR [place] -OR- Joni Reining, DNP.    Thank you for choosing [facility] at [facility]!!

## 2017-10-10 ENCOUNTER — Ambulatory Visit (HOSPITAL_COMMUNITY): Payer: Medicare HMO

## 2017-10-13 ENCOUNTER — Ambulatory Visit (HOSPITAL_COMMUNITY): Payer: Medicare HMO

## 2017-10-14 ENCOUNTER — Telehealth: Payer: Self-pay | Admitting: Adult Health

## 2017-10-14 ENCOUNTER — Encounter: Payer: Self-pay | Admitting: Adult Health

## 2017-10-14 NOTE — Telephone Encounter (Signed)
Patient aware of recommendations and verbalized understanding.  Advised will have scheduler reach out to get scheduled.   Message sent to admin.

## 2017-10-14 NOTE — Progress Notes (Signed)
Cardiology Office Note   Date:  10/15/2017   ID:  Mia Gonzales, DOB [birth year], MRN [id number]  PCP:  Glendon Axe, MD  Cardiologist:  [place]  Chief Complaint  Patient presents with  . Chest Pain  . Shortness of Breath     History of Present Illness: Mia Gonzales is a 67 y.o. female who presents for ongoing assessment and management of coronary artery disease, status post drug-eluting stent to the mid RCA in November 2018 with nonobstructive disease elsewhere within the coronary tree, hyperlipidemia, history of prior tobacco abuse, history of right renal carcinoma status post radical nephrectomy, and diabetes.  On last office visit she was complaining of severe fatigue and dyspnea on exertion.  We changed her Brilinta to Plavix and reloaded her 300 mg and then began 75 mg daily.  Metoprolol was changed to adjust dose from a.m. dose to help with daytime fatigue.  She called our office on 10/14/2017 with continued complaints of fatigue, recurrent chest pain, pain radiating from her mid sternal area to her back and right arm with associated shortness of breath, lasting only a few minutes.  She had one episode of chest discomfort that awoke her at night, she took nitroglycerin and the pain was resolved within 10 minutes.  She is here to discuss her ongoing symptoms.  She states that she has had recurrent chest pain some when she was out shopping which she described as pressure and ache substernal, and she has had 2 episodes over the last 2 nights when she was awakened by chest pain last night with the most severe.  She had to take nitroglycerin each time which did cause her symptoms to subside.  The pain was radiating to her jaw shoulders and neck.  Past Medical History:  Diagnosis Date  . Asthma 01/06/2017  . Coronary artery disease   . Essential hypertension 01/06/2017  . Fibromyalgia   . Pleuritic chest pain 01/06/2017  . Shortness of breath 01/06/2017    Past Surgical History:  Procedure  Laterality Date  . CARDIAC CATHETERIZATION  08/18/2017   S/P DES RCA  Dr Irish Lack  . CHOLECYSTECTOMY    . CORONARY STENT INTERVENTION N/A 08/18/2017   Procedure: CORONARY STENT INTERVENTION;  Surgeon: Jettie Booze, MD;  Location: [facility];  Service: Cardiovascular;  Laterality: N/A;  . LEFT HEART CATH AND CORONARY ANGIOGRAPHY N/A 08/18/2017   Procedure: LEFT HEART CATH AND CORONARY ANGIOGRAPHY;  Surgeon: Jettie Booze, MD;  Location: [facility];  Service: Cardiovascular;  Laterality: N/A;  . NEPHRECTOMY       Current Outpatient Medications  Medication Sig Dispense Refill  . atorvastatin (LIPITOR) 40 MG tablet Take 1 tablet (40 mg total) by mouth daily at 6 PM. 90 tablet 1  . Cholecalciferol (VITAMIN D3) 10000 units TABS Take 5,000 Units by mouth daily.    . clopidogrel (PLAVIX) 75 MG tablet Take 1 tablet (75 mg total) by mouth daily. START Friday TAKE 300MG (4 TABS-75MG) THEN 1 TAB-75MG THEREAFTER STARTING SATURDAY 33 tablet 3  . Coenzyme Q10 (COQ10) 100 MG CAPS Take 1 capsule by mouth daily. 30 each   . gabapentin (NEURONTIN) 300 MG capsule Take 300 mg by mouth 2 (two) times daily.     . metoprolol succinate (TOPROL-XL) 25 MG 24 hr tablet Take 1 tablet (25 mg total) by mouth daily. 90 tablet 1  . nitroGLYCERIN (NITROSTAT) 0.4 MG SL tablet Place 1 tablet (0.4 mg total) under the tongue every 5 (five) minutes  as needed for chest pain. 25 tablet 12  . venlafaxine XR (EFFEXOR-XR) 75 MG 24 hr capsule Take 75 mg by mouth 2 (two) times daily.      No current facility-administered medications for this visit.     Allergies:   Povidone-iodine; Tramadol-acetaminophen; Codeine; and Fentanyl    Social History:  The patient  reports that she has quit smoking. she has never used smokeless tobacco. She reports that she does not drink alcohol or use drugs.   Family History:  The patient's family history includes CAD in her sister; Cancer in her father and mother;  Cardiomyopathy in her sister; Diabetes in her father and paternal grandmother; Heart attack in her maternal grandfather and sister; Heart disease in her maternal grandfather and maternal grandmother; Rheum arthritis in her mother.    ROS: All other systems are reviewed and negative. Unless otherwise mentioned in H&P    PHYSICAL EXAM: VS:  There were no vitals taken for this visit. , BMI There is no height or weight on file to calculate BMI. GEN: Well nourished, well developed, in no acute distress  HEENT: normal  Neck: no JVD, carotid bruits, or masses Cardiac: RRR; no murmurs, rubs, or gallops,no edema  Respiratory:  clear to auscultation bilaterally, normal work of breathing GI: soft, nontender, nondistended, + BS MS: no deformity or atrophy  Skin: warm and dry, no rash Neuro:  Strength and sensation are intact Psych: euthymic mood, full affect   EKG: EKG revealed sinus bradycardia heart rate of 57 bpm, no acute ST-T wave abnormalities   Recent Labs: 08/17/2017: ALT 18 08/19/2017: BUN 21; Creatinine, Ser 1.12; Hemoglobin 12.7; Platelets 211; Potassium 4.1; Sodium 138    Lipid Panel    Component Value Date/Time   CHOL 205 (H) 08/17/2017 0015   TRIG 267 (H) 08/17/2017 0015   HDL 31 (L) 08/17/2017 0015   CHOLHDL 6.6 08/17/2017 0015   VLDL 53 (H) 08/17/2017 0015   LDLCALC 121 (H) 08/17/2017 0015      Wt Readings from Last 3 Encounters:  10/09/17 211 lb 12.8 oz (96.1 kg)  10/02/17 212 lb 8.4 oz (96.4 kg)  08/26/17 207 lb 3.2 oz (94 kg)      Other studies Reviewed: Cardiac Cath 08/18/2017 Conclusion     Mid RCA lesion is 99% stenosed. A drug-eluting stent was successfully placed using a STENT SIERRA 3.00 X 38 MM, postdilated to > 3.5 mm.  Post intervention, there is a 0% residual stenosis.  1st Mrg lesion is 25% stenosed.  Mid Cx lesion is 25% stenosed.  Mid LAD lesion is 20% stenosed.  The left ventricular ejection fraction is 55-65% by visual  estimate.  LV end diastolic pressure is mildly elevated. LVEDP 21 mm Hg.  The left ventricular systolic function is normal.  There is no aortic valve stenosis.  Nonobstructive disease in the left system. Continue dual antiplatelet therapy for one year with aggressive secondary prevention.     Echocardiogram Left ventricle: The cavity size was normal. Wall thickness was increased in a pattern of moderate LVH. Systolic function was vigorous. The estimated ejection fraction was in the range of 65% to 70%. Wall motion was normal; there were no regional wall motion abnormalities. Doppler parameters are consistent with abnormal left ventricular relaxation (grade 1 diastolic dysfunction). The E/e&' ratio is between 8-15, suggesting indeterminate LV filling pressure. - Aortic valve: Trileaflet. Sclerosis without stenosis. There was no regurgitation. - Mitral valve: Mildly thickened leaflets . There was trivial regurgitation. - Left  atrium: The atrium was normal in size. - Inferior vena cava: The vessel was normal in size. The respirophasic diameter changes were in the normal range (>= 50%), consistent with normal central venous pressure.      ASSESSMENT AND PLAN:  1.  Chest pain, worrisome for unstable angina in a patient with known history of coronary artery disease.  Patient has drug-eluting stent to the right coronary artery which was placed in November 2018.  Due to recurrent symptoms I feel it may be necessary for her to have a cardiac catheterization repeated for reevaluation of stent and evidence of restenosis.  I have spoken with Dr. Ellyn Hack, (DOD at [facility]) who has reviewed the chart, and is in agreement with need to repeat cardiac catheterization.  Dr. Ellyn Hack has walked over and met with the patient in the exam room as well and spoken with her about repeating her catheterization.  The patient understands that risks include but are not limited  to stroke (1 in 1000), death (1 in 37), kidney failure [usually temporary] (1 in 500), bleeding (1 in 200), allergic reaction [possibly serious] (1 in 200), and agrees to proceed.   In the interim, I have decided to begin isosorbide mononitrate 15 mg daily.  She will continue all other medications.  Cardiac catheterization has been scheduled for Friday, January 25 as first case.  Dr. Ellyn Hack will be performing.  2.  Hypertension: The patient will continue metoprolol 25 mg at at bedtime.  No additional antihypertensives are prescribed at this time.  3.  Hypercholesterolemia: She will continue on atorvastatin 40 mg daily.   Current medicines are reviewed at length with the patient today.    Labs/ tests ordered today include: Pre-Cardiac Cath orders and labs.    Phill Myron. West Pugh, ANP, AACC   10/15/2017 9:27 AM    [facility] [address] [place], [place] [address] Phone: [phone]; Fax: [phone]

## 2017-10-14 NOTE — Telephone Encounter (Signed)
Returned call to patient. Patient reports she was in for appointment on Thursday and beginning Friday her Brilinta was stopped and started on Plavix.    Reports since the medication change she has been experiencing episodes of chest pain.  Reports episode Sunday, mid sternal chest pain radiating to back and right arm +SOB, -diaphoresis/nausea/vomiting.    States this only lasted a "few" minutes.   Had another similar episode yesterday evening-patient thought the cold weather was contributing to the pain.    States last night had episode that woke her up from sleep, took 1 NTG and pain resolved within 10 mins.    Doesn't take BP at home, just bought cuff to start monitoring.  States HR 69-86.    Patient denies pain at current but is wondering if change in medication could be contributing to episodes.      Advised I would route to K. Lyman Bishop, DNP for review/recommendations.     Patient aware and verbalized understanding.

## 2017-10-14 NOTE — Telephone Encounter (Signed)
New Message  Pt c/o medication issue:  1. Name of Medication: Plavix   2. How are you currently taking this medication (dosage and times per day)? 75mg    3. Are you having a reaction (difficulty breathing--STAT)? Per pt will disclose upon call back   4. What is your medication issue? Pt would like to speak with RN about changing her medication

## 2017-10-14 NOTE — Telephone Encounter (Signed)
Please have her seen in clinic again. May have to consider repeating stress test to evaluate further vs repeating cardiac cath. She will also need referral to pulmonology due to continuing dyspnea. That referral can be done on next office visit. If possible have her see Dr. Duke Salvia, If not, I am happy to see her again.

## 2017-10-15 ENCOUNTER — Ambulatory Visit (HOSPITAL_COMMUNITY): Payer: Medicare HMO

## 2017-10-15 ENCOUNTER — Ambulatory Visit: Payer: Medicare HMO | Admitting: Adult Health

## 2017-10-15 ENCOUNTER — Encounter (HOSPITAL_COMMUNITY): Payer: Self-pay | Admitting: *Deleted

## 2017-10-15 ENCOUNTER — Encounter: Payer: Self-pay | Admitting: Adult Health

## 2017-10-15 VITALS — BP 132/68 | HR 57 | Ht 67.0 in | Wt 212.6 lb

## 2017-10-15 DIAGNOSIS — I200 Unstable angina: Secondary | ICD-10-CM | POA: Diagnosis not present

## 2017-10-15 DIAGNOSIS — R079 Chest pain, unspecified: Secondary | ICD-10-CM | POA: Diagnosis not present

## 2017-10-15 DIAGNOSIS — I25110 Atherosclerotic heart disease of native coronary artery with unstable angina pectoris: Secondary | ICD-10-CM | POA: Insufficient documentation

## 2017-10-15 DIAGNOSIS — I25119 Atherosclerotic heart disease of native coronary artery with unspecified angina pectoris: Secondary | ICD-10-CM

## 2017-10-15 MED ORDER — ISOSORBIDE MONONITRATE ER 30 MG PO TB24: 15.0000 mg | ORAL_TABLET | Freq: Every day | ORAL | 3 refills | Status: DC

## 2017-10-15 NOTE — Patient Instructions (Addendum)
Medication Instructions:   START ISOSORBIDE 15MG  (1/2-TABLET)  If you need a refill on your cardiac medications before your next appointment, please call your pharmacy.  Labwork: CBC, PT/INR AND BMET TODAY HERE IN OUR OFFICE AT [facility]  Take the provided lab slips for you to take with you to the lab for you blood draw.   Testing/Procedures: Your physician has requested that you have a cardiac catheterization. Cardiac catheterization is used to diagnose and/or treat various heart conditions. Doctors may recommend this procedure for a number of different reasons. The most common reason is to evaluate chest pain. Chest pain can be a symptom of coronary artery disease (CAD), and cardiac catheterization can show whether plaque is narrowing or blocking your heart's arteries. This procedure is also used to evaluate the valves, as well as measure the blood flow and oxygen levels in different parts of your heart. For further information please visit [email or web address] Please follow instruction sheet, as given.  Special Instructions:  CANCEL CARDIAC REHAB APPT BEFORE PROCEDURE  Follow-Up: Your physician wants you to follow-up in: AFTER CATH WITH DR HARDING.  Thank you for choosing [facility] at [facility]!!      [facility] CARDIOVASCULAR DIVISION [facility] [address] [place] [address] Dept: [phone] Loc: [phone]  Paul Mogul  10/15/2017  You are scheduled for a Cardiac Catheterization on Friday, January 25 with Dr. Bryan Lemma.  1. Please arrive at the [facility] (Main Entrance A) at [facility]: [address] [place], [place] [address] at 5:30 AM (two hours before your procedure to ensure your preparation). Free valet parking service is available.   Special note: Every effort is made to have your procedure done on time. Please understand that emergencies sometimes delay scheduled procedures.  2.  Diet: Do not eat or drink anything after midnight prior to your procedure except sips of water to take medications.  3. Labs: You will need to have blood drawn on Wednesday, January 23 at [facility] [address] , [place]  Open: 8am - 5pm (Lunch 12:30 - 1:30)   Phone: [phone]. You do not need to be fasting.  4. Medication instructions in preparation for your procedure:   On the morning of your procedure, take ALL your morning medicines NOT listed above.  You may use sips of water.  5. Plan for one night stay--bring personal belongings. 6. Bring a current list of your medications and current insurance cards. 7. You MUST have a responsible person to drive you home. 8. Someone MUST be with you the first 24 hours after you arrive home or your discharge will be delayed. 9. Please wear clothes that are easy to get on and off and wear slip-on shoes.  Thank you for allowing [place] to care for you!   -- [facility]

## 2017-10-15 NOTE — Progress Notes (Signed)
Cardiac Individual Treatment Plan  Patient Details  Name: Mia Gonzales MRN: [id number] Date of Birth: Jun 10, 1951 Referring Provider:     CARDIAC REHAB PHASE II ORIENTATION from 10/02/2017 in [facility]AC REHAB  Referring Provider  Chilton Si MD      Initial Encounter Date:    CARDIAC REHAB PHASE II ORIENTATION from 10/02/2017 in [facility] CARDIAC REHAB  Date  10/02/17  Referring Provider  Chilton Si MD      Visit Diagnosis: No diagnosis found.  Patient's Home Medications on Admission:  Current Outpatient Medications:  .  aspirin EC 81 MG tablet, Take 81 mg by mouth daily., Disp: , Rfl:  .  atorvastatin (LIPITOR) 40 MG tablet, Take 1 tablet (40 mg total) by mouth daily at 6 PM., Disp: 90 tablet, Rfl: 1 .  Cholecalciferol (VITAMIN D3) 10000 units TABS, Take 5,000 Units by mouth daily., Disp: , Rfl:  .  clopidogrel (PLAVIX) 75 MG tablet, Take 1 tablet (75 mg total) by mouth daily. START Friday TAKE 300MG  (4 TABS-75MG ) THEN 1 TAB-75MG  THEREAFTER STARTING SATURDAY (Patient taking differently: Take 75 mg by mouth daily. ), Disp: 33 tablet, Rfl: 3 .  Coenzyme Q10 (COQ10) 100 MG CAPS, Take 1 capsule by mouth daily., Disp: 30 each, Rfl:  .  gabapentin (NEURONTIN) 300 MG capsule, Take 300 mg by mouth 2 (two) times daily. , Disp: , Rfl:  .  isosorbide mononitrate (IMDUR) 30 MG 24 hr tablet, Take 0.5 tablets (15 mg total) by mouth daily., Disp: 15 tablet, Rfl: 3 .  metoprolol succinate (TOPROL-XL) 25 MG 24 hr tablet, Take 1 tablet (25 mg total) by mouth daily., Disp: 90 tablet, Rfl: 1 .  nitroGLYCERIN (NITROSTAT) 0.4 MG SL tablet, Place 1 tablet (0.4 mg total) under the tongue every 5 (five) minutes as needed for chest pain., Disp: 25 tablet, Rfl: 12 .  venlafaxine XR (EFFEXOR-XR) 75 MG 24 hr capsule, Take 75 mg by mouth 2 (two) times daily. , Disp: , Rfl:   Past Medical History: Past Medical History:  Diagnosis Date  . Asthma 01/06/2017  .  Coronary artery disease   . Essential hypertension 01/06/2017  . Fibromyalgia   . Pleuritic chest pain 01/06/2017  . Shortness of breath 01/06/2017    Tobacco Use: Social History   Tobacco Use  Smoking Status Former Smoker  Smokeless Tobacco Never Used    Labs: Recent Review [facility] [employer] [facility] Latest Ref Rng & Units 08/17/2017   Cholestrol 0 - 200 mg/dL 409(W)   LDLCALC 0 - 99 mg/dL 119(J)   HDL >47 mg/dL 82(N)   Trlycerides <562 mg/dL 130(Q)   Hemoglobin M5H 4.8 - 5.6 % 7.6(H)      Capillary Blood Glucose: Lab Results  Component Value Date   GLUCAP 105 (H) 08/19/2017   GLUCAP 143 (H) 08/19/2017   GLUCAP 170 (H) 08/18/2017   GLUCAP 103 (H) 08/18/2017   GLUCAP 115 (H) 08/18/2017     Exercise Target Goals:    Exercise Program Goal: Individual exercise prescription set using results from initial 6 min walk test and THRR while considering  patient's activity barriers and safety.   Exercise Prescription Goal: Initial exercise prescription builds to 30-45 minutes a day of aerobic activity, 2-3 days per week.  Home exercise guidelines will be given to patient during program as part of exercise prescription that the participant will acknowledge.  Activity Barriers & Risk Stratification: Activity Barriers &  Cardiac Risk Stratification - 10/02/17 1516      Activity Barriers & Cardiac Risk Stratification   Activity Barriers  Arthritis;Deconditioning;Muscular Weakness;Fibromyalgia       6 Minute Walk: 6 Minute Walk    Row Name 10/02/17 1426 10/02/17 1458 10/02/17 1509     6 Minute Walk   Phase  Initial  -  -   Distance  1036 feet  -  1136 feet   Walk Time  6 minutes  -  -   # of Rest Breaks  0  -  -   MPH  1.96  -  2.2   METS  2.04  -  2.3   RPE  11  -  -   Perceived Dyspnea   1  -  -   VO2 Peak  7.14  -  7.88   Symptoms  Yes (comment)  -  -   Comments  mild SOB  -  -   Resting HR  66 bpm  -  -   Resting BP  108/80  -  -    Resting Oxygen Saturation   98 %  -  -   Exercise Oxygen Saturation  during 6 min walk  97 %  -  -   Max Ex. HR  92 bpm  96 bpm  -   Max Ex. BP  128/78  -  -   2 Minute Post BP  -  -  110/68      Oxygen Initial Assessment:   Oxygen Re-Evaluation:   Oxygen Discharge (Final Oxygen Re-Evaluation):   Initial Exercise Prescription: Initial Exercise Prescription - 10/02/17 1400      Date of Initial Exercise RX and Referring Provider   Date  10/02/17    Referring Provider  Chilton Si MD      NuStep   Level  2    SPM  70    Minutes  10    METs  1.5      Arm Ergometer   Level  2    Minutes  10    METs  1.5      Track   Laps  6    Minutes  10    METs  2.03      Prescription Details   Frequency (times per week)  3    Duration  Progress to 30 minutes of continuous aerobic without signs/symptoms of physical distress      Intensity   THRR 40-80% of Max Heartrate  61-122    Ratings of Perceived Exertion  11-15    Perceived Dyspnea  0-4      Progression   Progression  Continue to progress workloads to maintain intensity without signs/symptoms of physical distress.      Resistance Training   Training Prescription  Yes    Weight  2lbs    Reps  10-15       Perform Capillary Blood Glucose checks as needed.  Exercise Prescription Changes:   Exercise Comments:   Exercise Goals and Review: Exercise Goals    Row Name 10/02/17 1535             Exercise Goals   Increase Physical Activity  Yes       Intervention  Provide advice, education, support and counseling about physical activity/exercise needs.;Develop an individualized exercise prescription for aerobic and resistive training based on initial evaluation findings, risk stratification, comorbidities and participant's personal goals.       Expected Outcomes  Achievement of increased cardiorespiratory fitness and enhanced flexibility, muscular endurance and strength shown through measurements of  functional capacity and personal statement of participant.       Increase Strength and Stamina  Yes       Intervention  Provide advice, education, support and counseling about physical activity/exercise needs.;Develop an individualized exercise prescription for aerobic and resistive training based on initial evaluation findings, risk stratification, comorbidities and participant's personal goals.       Expected Outcomes  Achievement of increased cardiorespiratory fitness and enhanced flexibility, muscular endurance and strength shown through measurements of functional capacity and personal statement of participant.       Able to understand and use rate of perceived exertion (RPE) scale  Yes       Intervention  Provide education and explanation on how to use RPE scale       Expected Outcomes  Short Term: Able to use RPE daily in rehab to express subjective intensity level;Long Term:  Able to use RPE to guide intensity level when exercising independently       Knowledge and understanding of Target Heart Rate Range (THRR)  Yes       Intervention  Provide education and explanation of THRR including how the numbers were predicted and where they are located for reference       Expected Outcomes  Short Term: Able to state/look up THRR;Short Term: Able to use daily as guideline for intensity in rehab;Long Term: Able to use THRR to govern intensity when exercising independently       Able to check pulse independently  Yes       Intervention  Provide education and demonstration on how to check pulse in carotid and radial arteries.;Review the importance of being able to check your own pulse for safety during independent exercise       Expected Outcomes  Long Term: Able to check pulse independently and accurately;Short Term: Able to explain why pulse checking is important during independent exercise       Understanding of Exercise Prescription  Yes       Intervention  Provide education, explanation, and written  materials on patient's individual exercise prescription       Expected Outcomes  Short Term: Able to explain program exercise prescription;Long Term: Able to explain home exercise prescription to exercise independently          Exercise Goals Re-Evaluation :    Discharge Exercise Prescription (Final Exercise Prescription Changes):   Nutrition:  Target Goals: Understanding of nutrition guidelines, daily intake of sodium 1500mg , cholesterol 200mg , calories 30% from fat and 7% or less from saturated fats, daily to have 5 or more servings of fruits and vegetables.  Biometrics: Pre Biometrics - 10/02/17 1607      Pre Biometrics   Height  5\' 5"  (1.651 m)    Weight  212 lb 8.4 oz (96.4 kg)    Waist Circumference  37.5 inches    Hip Circumference  47 inches    Waist to Hip Ratio  0.8 %    BMI (Calculated)  35.37    Triceps Skinfold  48 mm    % Body Fat  46.5 %    Grip Strength  21 kg    Flexibility  12 in    Single Leg Stand  3.56 seconds        Nutrition Therapy Plan and Nutrition Goals: Nutrition Therapy & Goals - 10/02/17 1527      Nutrition Therapy   Diet  Carb Modified, Heart Healthy      Personal Nutrition Goals   Nutrition Goal  Pt to identify food quantities necessary to achieve weight loss of 6-24 lb (2.7-10.9 kg) at graduation from cardiac rehab. Goal wt of less than 200 lb desired.     Personal Goal #2  Improved blood glucose control as evidenced by pt's A1c trending from 7.6 toward less than 7.0.    Personal Goal #3  Pt able to name foods that affect blood glucose       Intervention Plan   Intervention  Prescribe, educate and counsel regarding individualized specific dietary modifications aiming towards targeted core components such as weight, hypertension, lipid management, diabetes, heart failure and other comorbidities.    Expected Outcomes  Short Term Goal: Understand basic principles of dietary content, such as calories, fat, sodium, cholesterol and  nutrients.;Long Term Goal: Adherence to prescribed nutrition plan.       Nutrition Assessments:   Nutrition Goals Re-Evaluation:   Nutrition Goals Re-Evaluation:   Nutrition Goals Discharge (Final Nutrition Goals Re-Evaluation):   Psychosocial: Target Goals: Acknowledge presence or absence of significant depression and/or stress, maximize coping skills, provide positive support system. Participant is able to verbalize types and ability to use techniques and skills needed for reducing stress and depression.  Initial Review & Psychosocial Screening: Initial Psych Review & Screening - 10/02/17 1551      Initial Review   Current issues with  None Identified      Family Dynamics   Good Support System?  Yes Brecklyn has her sister and son for support      Barriers   Psychosocial barriers to participate in program  There are no identifiable barriers or psychosocial needs.      Screening Interventions   Interventions  Encouraged to exercise       Quality of Life Scores:  Scores of 19 and below usually indicate a poorer quality of life in these areas.  A difference of  2-3 points is a clinically meaningful difference.  A difference of 2-3 points in the total score of the Quality of Life Index has been associated with significant improvement in overall quality of life, self-image, physical symptoms, and general health in studies assessing change in quality of life.  PHQ-9: Recent Review Flowsheet Data    There is no flowsheet data to display.     Interpretation of Total Score  Total Score Depression Severity:  1-4 = Minimal depression, 5-9 = Mild depression, 10-14 = Moderate depression, 15-19 = Moderately severe depression, 20-27 = Severe depression   Psychosocial Evaluation and Intervention:   Psychosocial Re-Evaluation:   Psychosocial Discharge (Final Psychosocial Re-Evaluation):   Vocational Rehabilitation: Provide vocational rehab assistance to qualifying  candidates.   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 Participant will state understanding/return demonstration of topics presented.  Learning Barriers/Preferences: Learning Barriers/Preferences - 10/02/17 1535      Learning Barriers/Preferences   Learning Preferences  Individual Instruction;Group Instruction;Pictoral;Skilled Demonstration       Education Topics: Count Your Pulse:  -Group instruction provided by verbal instruction, demonstration, patient participation and written materials to support subject.  Instructors address  importance of being able to find your pulse and how to count your pulse when at home without a heart monitor.  Patients get hands on experience counting their pulse with staff help and individually.   Heart Attack, Angina, and Risk Factor Modification:  -Group instruction provided by verbal instruction, video, and written materials to support subject.  Instructors address signs and symptoms of angina and heart attacks.    Also discuss risk factors for heart disease and how to make changes to improve heart health risk factors.   Functional Fitness:  -Group instruction provided by verbal instruction, demonstration, patient participation, and written materials to support subject.  Instructors address safety measures for doing things around the house.  Discuss how to get up and down off the floor, how to pick things up properly, how to safely get out of a chair without assistance, and balance training.   Meditation and Mindfulness:  -Group instruction provided by verbal instruction, patient participation, and  written materials to support subject.  Instructor addresses importance of mindfulness and meditation practice to help reduce stress and improve awareness.  Instructor also leads participants through a meditation exercise.    Stretching for Flexibility and Mobility:  -Group instruction provided by verbal instruction, patient participation, and written materials to support subject.  Instructors lead participants through series of stretches that are designed to increase flexibility thus improving mobility.  These stretches are additional exercise for major muscle groups that are typically performed during regular warm up and cool down.   Hands Only CPR:  -Group verbal, video, and participation provides a basic overview of AHA guidelines for community CPR. Role-play of emergencies allow participants the opportunity to practice calling for help and chest compression technique with discussion of AED use.   Hypertension: -Group verbal and written instruction that provides a basic overview of hypertension including the most recent diagnostic guidelines, risk factor reduction with self-care instructions and medication management.    Nutrition I class: Heart Healthy Eating:  -Group instruction provided by PowerPoint slides, verbal discussion, and written materials to support subject matter. The instructor gives an explanation and review of the Therapeutic Lifestyle Changes diet recommendations, which includes a discussion on lipid goals, dietary fat, sodium, fiber, plant stanol/sterol esters, sugar, and the components of a well-balanced, healthy diet.   CARDIAC REHAB PHASE II ORIENTATION from 10/02/2017 in [facility] CARDIAC REHAB  Date  09/25/17  Educator  RD  Instruction Review Code  Not applicable      Nutrition II class: Lifestyle Skills:  -Group instruction provided by PowerPoint slides, verbal discussion, and written materials to support subject matter. The instructor gives an  explanation and review of label reading, grocery shopping for heart health, heart healthy recipe modifications, and ways to make healthier choices when eating out.   CARDIAC REHAB PHASE II ORIENTATION from 10/02/2017 in [facility] CARDIAC REHAB  Date  10/02/17  Educator  RD  Instruction Review Code  Not applicable      Diabetes Question & Answer:  -Group instruction provided by PowerPoint slides, verbal discussion, and written materials to support subject matter. The instructor gives an explanation and review of diabetes co-morbidities, pre- and post-prandial blood glucose goals, pre-exercise blood glucose goals, signs, symptoms, and treatment of hypoglycemia and hyperglycemia, and foot care basics.   Diabetes Blitz:  -Group instruction provided by PowerPoint slides, verbal discussion, and written materials to support subject matter. The instructor gives an explanation and review of the physiology behind type 1 and  type 2 diabetes, diabetes medications and rational behind using different medications, pre- and post-prandial blood glucose recommendations and Hemoglobin A1c goals, diabetes diet, and exercise including blood glucose guidelines for exercising safely.    Portion Distortion:  -Group instruction provided by PowerPoint slides, verbal discussion, written materials, and food models to support subject matter. The instructor gives an explanation of serving size versus portion size, changes in portions sizes over the last 20 years, and what consists of a serving from each food group.   Stress Management:  -Group instruction provided by verbal instruction, video, and written materials to support subject matter.  Instructors review role of stress in heart disease and how to cope with stress positively.     Exercising on Your Own:  -Group instruction provided by verbal instruction, power point, and written materials to support subject.  Instructors discuss benefits of  exercise, components of exercise, frequency and intensity of exercise, and end points for exercise.  Also discuss use of nitroglycerin and activating EMS.  Review options of places to exercise outside of rehab.  Review guidelines for sex with heart disease.   Cardiac Drugs I:  -Group instruction provided by verbal instruction and written materials to support subject.  Instructor reviews cardiac drug classes: antiplatelets, anticoagulants, beta blockers, and statins.  Instructor discusses reasons, side effects, and lifestyle considerations for each drug class.   Cardiac Drugs II:  -Group instruction provided by verbal instruction and written materials to support subject.  Instructor reviews cardiac drug classes: angiotensin converting enzyme inhibitors (ACE-I), angiotensin II receptor blockers (ARBs), nitrates, and calcium channel blockers.  Instructor discusses reasons, side effects, and lifestyle considerations for each drug class.   Anatomy and Physiology of the Circulatory System:  Group verbal and written instruction and models provide basic cardiac anatomy and physiology, with the coronary electrical and arterial systems. Review of: AMI, Angina, Valve disease, Heart Failure, Peripheral Artery Disease, Cardiac Arrhythmia, Pacemakers, and the ICD.   Other Education:  -Group or individual verbal, written, or video instructions that support the educational goals of the cardiac rehab program.   Knowledge Questionnaire Score: Knowledge Questionnaire Score - 10/02/17 1528      Knowledge Questionnaire Score   Pre Score  23/24       Core Components/Risk Factors/Patient Goals at Admission: Personal Goals and Risk Factors at Admission - 10/02/17 1417      Core Components/Risk Factors/Patient Goals on Admission    Weight Management  Yes    Intervention  Weight Management: Develop a combined nutrition and exercise program designed to reach desired caloric intake, while maintaining appropriate  intake of nutrient and fiber, sodium and fats, and appropriate energy expenditure required for the weight goal.;Weight Management: Provide education and appropriate resources to help participant work on and attain dietary goals.;Weight Management/Obesity: Establish reasonable short term and long term weight goals.;Obesity: Provide education and appropriate resources to help participant work on and attain dietary goals.    Expected Outcomes  Short Term: Continue to assess and modify interventions until short term weight is achieved;Long Term: Adherence to nutrition and physical activity/exercise program aimed toward attainment of established weight goal;Weight Maintenance: Understanding of the daily nutrition guidelines, which includes 25-35% calories from fat, 7% or less cal from saturated fats, less than 200mg  cholesterol, less than 1.5gm of sodium, & 5 or more servings of fruits and vegetables daily;Weight Loss: Understanding of general recommendations for a balanced deficit meal plan, which promotes 1-2 lb weight loss per week and includes a negative energy balance of  [id number] kcal/d;Understanding recommendations for meals to include 15-35% energy as protein, 25-35% energy from fat, 35-60% energy from carbohydrates, less than 200mg  of dietary cholesterol, 20-35 gm of total fiber daily;Understanding of distribution of calorie intake throughout the day with the consumption of 4-5 meals/snacks    Diabetes  Yes    Intervention  Provide education about signs/symptoms and action to take for hypo/hyperglycemia.;Provide education about proper nutrition, including hydration, and aerobic/resistive exercise prescription along with prescribed medications to achieve blood glucose in normal ranges: Fasting glucose 65-99 mg/dL    Expected Outcomes  Short Term: Participant verbalizes understanding of the signs/symptoms and immediate care of hyper/hypoglycemia, proper foot care and importance of medication, aerobic/resistive  exercise and nutrition plan for blood glucose control.;Long Term: Attainment of HbA1C < 7%.    Lipids  Yes    Intervention  Provide education and support for participant on nutrition & aerobic/resistive exercise along with prescribed medications to achieve LDL 70mg , HDL >40mg .    Expected Outcomes  Short Term: Participant states understanding of desired cholesterol values and is compliant with medications prescribed. Participant is following exercise prescription and nutrition guidelines.;Long Term: Cholesterol controlled with medications as prescribed, with individualized exercise RX and with personalized nutrition plan. Value goals: LDL < 70mg , HDL > 40 mg.       Core Components/Risk Factors/Patient Goals Review:    Core Components/Risk Factors/Patient Goals at Discharge (Final Review):    ITP Comments: ITP Comments    Row Name 10/02/17 1413 10/15/17 1748         ITP Comments  Dr Armanda Magic, Medical Director  30 Day ITP review. Mia Gonzales cardiac rehab is on hold as she is scheduled for catherization on Friday.         Comments: See ITP comments.Gladstone Lighter, RN,BSN 10/15/2017 5:50 PM

## 2017-10-16 ENCOUNTER — Telehealth: Payer: Self-pay | Admitting: *Deleted

## 2017-10-16 LAB — BASIC METABOLIC PANEL
BUN / CREAT RATIO: 18 (ref 12–28)
BUN: 17 mg/dL (ref 8–27)
CO2: 27 mmol/L (ref 20–29)
CREATININE: 0.96 mg/dL (ref 0.57–1.00)
Calcium: 9.3 mg/dL (ref 8.7–10.3)
Chloride: 98 mmol/L (ref 96–106)
GFR calc Af Amer: 71 mL/min/{1.73_m2} (ref >59)
GFR, EST NON AFRICAN AMERICAN: 61 mL/min/{1.73_m2} (ref >59)
GLUCOSE: 161 mg/dL — AB (ref 65–99)
POTASSIUM: 4.9 mmol/L (ref 3.5–5.2)
SODIUM: 140 mmol/L (ref 134–144)

## 2017-10-16 LAB — PROTIME-INR
INR: 1 (ref 0.8–1.2)
Prothrombin Time: 10.4 s (ref 9.1–12.0)

## 2017-10-16 LAB — CBC
Hematocrit: 41.1 % (ref 34.0–46.6)
Hemoglobin: 13.7 g/dL (ref 11.1–15.9)
MCH: 28.2 pg (ref 26.6–33.0)
MCHC: 33.3 g/dL (ref 31.5–35.7)
MCV: 85 fL (ref 79–97)
PLATELETS: 261 10*3/uL (ref 150–379)
RBC: 4.86 x10E6/uL (ref 3.77–5.28)
RDW: 13.6 % (ref 12.3–15.4)
WBC: 10 10*3/uL (ref 3.4–10.8)

## 2017-10-16 NOTE — Telephone Encounter (Signed)
Patient contacted pre-catheterization at [facility] scheduled for: 10/17/2017 [facility] Verified arrival time and place: [facility] NT/Main A 5:30 AM/7:30 AM  Confirmed AM meds to be taken pre-cath with sip of water:  Take Aspirin 81 mg AM of Take Plavix 75 mg AM of  Confirmed patient has responsible person to drive home post procedure and observe patient for 24 hours: yes

## 2017-10-17 ENCOUNTER — Encounter (HOSPITAL_COMMUNITY): Payer: Medicare HMO

## 2017-10-17 ENCOUNTER — Encounter (HOSPITAL_COMMUNITY)
Admission: RE | Disposition: A | Discharge status: Home / Self Care | Payer: Self-pay | Source: Ambulatory Visit | Attending: Cardiology

## 2017-10-17 ENCOUNTER — Ambulatory Visit (HOSPITAL_COMMUNITY)
Admission: RE | Admit: 2017-10-17 | Discharge: 2017-10-17 | Disposition: A | Discharge status: Home / Self Care | Payer: Medicare HMO | Source: Ambulatory Visit | Attending: Cardiology | Admitting: Cardiology

## 2017-10-17 DIAGNOSIS — Z85528 Personal history of other malignant neoplasm of kidney: Secondary | ICD-10-CM | POA: Diagnosis not present

## 2017-10-17 DIAGNOSIS — E669 Obesity, unspecified: Secondary | ICD-10-CM | POA: Insufficient documentation

## 2017-10-17 DIAGNOSIS — Z955 Presence of coronary angioplasty implant and graft: Secondary | ICD-10-CM | POA: Diagnosis not present

## 2017-10-17 DIAGNOSIS — Z885 Allergy status to narcotic agent status: Secondary | ICD-10-CM | POA: Diagnosis not present

## 2017-10-17 DIAGNOSIS — I10 Essential (primary) hypertension: Secondary | ICD-10-CM | POA: Diagnosis not present

## 2017-10-17 DIAGNOSIS — Z6833 Body mass index (BMI) 33.0-33.9, adult: Secondary | ICD-10-CM | POA: Diagnosis not present

## 2017-10-17 DIAGNOSIS — M797 Fibromyalgia: Secondary | ICD-10-CM | POA: Insufficient documentation

## 2017-10-17 DIAGNOSIS — E1169 Type 2 diabetes mellitus with other specified complication: Secondary | ICD-10-CM | POA: Diagnosis present

## 2017-10-17 DIAGNOSIS — Z87891 Personal history of nicotine dependence: Secondary | ICD-10-CM | POA: Diagnosis not present

## 2017-10-17 DIAGNOSIS — J45909 Unspecified asthma, uncomplicated: Secondary | ICD-10-CM | POA: Diagnosis not present

## 2017-10-17 DIAGNOSIS — Z905 Acquired absence of kidney: Secondary | ICD-10-CM | POA: Insufficient documentation

## 2017-10-17 DIAGNOSIS — I200 Unstable angina: Secondary | ICD-10-CM

## 2017-10-17 DIAGNOSIS — Z886 Allergy status to analgesic agent status: Secondary | ICD-10-CM | POA: Insufficient documentation

## 2017-10-17 DIAGNOSIS — I25110 Atherosclerotic heart disease of native coronary artery with unstable angina pectoris: Secondary | ICD-10-CM | POA: Diagnosis not present

## 2017-10-17 DIAGNOSIS — Z888 Allergy status to other drugs, medicaments and biological substances status: Secondary | ICD-10-CM | POA: Diagnosis not present

## 2017-10-17 DIAGNOSIS — E119 Type 2 diabetes mellitus without complications: Secondary | ICD-10-CM | POA: Insufficient documentation

## 2017-10-17 DIAGNOSIS — Z79899 Other long term (current) drug therapy: Secondary | ICD-10-CM | POA: Diagnosis not present

## 2017-10-17 DIAGNOSIS — E7800 Pure hypercholesterolemia, unspecified: Secondary | ICD-10-CM | POA: Insufficient documentation

## 2017-10-17 DIAGNOSIS — R079 Chest pain, unspecified: Secondary | ICD-10-CM

## 2017-10-17 HISTORY — PX: LEFT HEART CATH AND CORONARY ANGIOGRAPHY: CATH118249

## 2017-10-17 HISTORY — PX: CORONARY STENT INTERVENTION: CATH118234

## 2017-10-17 LAB — POCT ACTIVATED CLOTTING TIME: Activated Clotting Time: 340 seconds

## 2017-10-17 SURGERY — LEFT HEART CATH AND CORONARY ANGIOGRAPHY
Anesthesia: LOCAL

## 2017-10-17 MED ORDER — HEPARIN (PORCINE) IN NACL 2-0.9 UNIT/ML-% IJ SOLN
INTRAMUSCULAR | Status: AC
  Filled 2017-10-17: qty 1000

## 2017-10-17 MED ORDER — SODIUM CHLORIDE 0.9% FLUSH: 3.0000 mL | Freq: Two times a day (BID) | INTRAVENOUS | Status: DC

## 2017-10-17 MED ORDER — IOPAMIDOL (ISOVUE-370) INJECTION 76%
INTRAVENOUS | Status: DC | PRN
  Administered 2017-10-17: 150 mL via INTRA_ARTERIAL

## 2017-10-17 MED ORDER — HEPARIN (PORCINE) IN NACL 2-0.9 UNIT/ML-% IJ SOLN
INTRAMUSCULAR | Status: AC | PRN
  Administered 2017-10-17: 1500 mL

## 2017-10-17 MED ORDER — GABAPENTIN 300 MG PO CAPS: 300.0000 mg | ORAL_CAPSULE | Freq: Two times a day (BID) | ORAL | Status: DC

## 2017-10-17 MED ORDER — FENTANYL CITRATE (PF) 100 MCG/2ML IJ SOLN
INTRAMUSCULAR | Status: AC
  Filled 2017-10-17: qty 2

## 2017-10-17 MED ORDER — COQ10 100 MG PO CAPS: 1.0000 | ORAL_CAPSULE | Freq: Every day | ORAL | Status: DC

## 2017-10-17 MED ORDER — IOPAMIDOL (ISOVUE-370) INJECTION 76%
INTRAVENOUS | Status: AC
  Filled 2017-10-17: qty 100

## 2017-10-17 MED ORDER — NITROGLYCERIN 0.4 MG SL SUBL: 0.4000 mg | SUBLINGUAL_TABLET | SUBLINGUAL | Status: DC | PRN

## 2017-10-17 MED ORDER — FENTANYL CITRATE (PF) 100 MCG/2ML IJ SOLN
INTRAMUSCULAR | Status: DC | PRN
  Administered 2017-10-17 (×3): 25 ug via INTRAVENOUS

## 2017-10-17 MED ORDER — HEPARIN SODIUM (PORCINE) 1000 UNIT/ML IJ SOLN
INTRAMUSCULAR | Status: DC | PRN
  Administered 2017-10-17 (×2): 5000 [IU] via INTRAVENOUS

## 2017-10-17 MED ORDER — NITROGLYCERIN 1 MG/10 ML FOR IR/CATH LAB
INTRA_ARTERIAL | Status: AC
  Filled 2017-10-17: qty 10

## 2017-10-17 MED ORDER — SODIUM CHLORIDE 0.9% FLUSH: 3.0000 mL | INTRAVENOUS | Status: DC | PRN

## 2017-10-17 MED ORDER — ASPIRIN EC 81 MG PO TBEC: 81.0000 mg | DELAYED_RELEASE_TABLET | Freq: Every day | ORAL | Status: DC

## 2017-10-17 MED ORDER — VERAPAMIL HCL 2.5 MG/ML IV SOLN
INTRAVENOUS | Status: DC | PRN
  Administered 2017-10-17: 10 mL via INTRA_ARTERIAL

## 2017-10-17 MED ORDER — LABETALOL HCL 5 MG/ML IV SOLN: 10.0000 mg | INTRAVENOUS | Status: DC | PRN

## 2017-10-17 MED ORDER — MIDAZOLAM HCL 2 MG/2ML IJ SOLN
INTRAMUSCULAR | Status: AC
  Filled 2017-10-17: qty 2

## 2017-10-17 MED ORDER — SODIUM CHLORIDE 0.9 % IV SOLN: 250.0000 mL | INTRAVENOUS | Status: DC | PRN

## 2017-10-17 MED ORDER — ISOSORBIDE MONONITRATE ER 30 MG PO TB24: 30.0000 mg | ORAL_TABLET | Freq: Every day | ORAL | 6 refills | Status: DC

## 2017-10-17 MED ORDER — ONDANSETRON HCL 4 MG/2ML IJ SOLN: 4.0000 mg | Freq: Four times a day (QID) | INTRAMUSCULAR | Status: DC | PRN

## 2017-10-17 MED ORDER — IOPAMIDOL (ISOVUE-370) INJECTION 76%
INTRAVENOUS | Status: AC
  Filled 2017-10-17: qty 50

## 2017-10-17 MED ORDER — VERAPAMIL HCL 2.5 MG/ML IV SOLN
INTRAVENOUS | Status: AC
  Filled 2017-10-17: qty 2

## 2017-10-17 MED ORDER — SODIUM CHLORIDE 0.9 % IV SOLN
INTRAVENOUS | Status: DC
  Administered 2017-10-17: 06:00:00 via INTRAVENOUS

## 2017-10-17 MED ORDER — ATORVASTATIN CALCIUM 80 MG PO TABS: 80.0000 mg | ORAL_TABLET | Freq: Every day | ORAL | 3 refills | Status: DC

## 2017-10-17 MED ORDER — ASPIRIN 81 MG PO CHEW: 81.0000 mg | CHEWABLE_TABLET | ORAL | Status: DC

## 2017-10-17 MED ORDER — NITROGLYCERIN 1 MG/10 ML FOR IR/CATH LAB
INTRA_ARTERIAL | Status: DC | PRN
  Administered 2017-10-17: 200 ug via INTRACORONARY
  Administered 2017-10-17: 100 ug via INTRACORONARY

## 2017-10-17 MED ORDER — HEPARIN SODIUM (PORCINE) 1000 UNIT/ML IJ SOLN
INTRAMUSCULAR | Status: AC
  Filled 2017-10-17: qty 1

## 2017-10-17 MED ORDER — ISOSORBIDE MONONITRATE ER 30 MG PO TB24: 30.0000 mg | ORAL_TABLET | Freq: Every day | ORAL | Status: DC

## 2017-10-17 MED ORDER — SODIUM CHLORIDE 0.9 % IV SOLN: INTRAVENOUS | Status: AC

## 2017-10-17 MED ORDER — VENLAFAXINE HCL ER 75 MG PO CP24: 75.0000 mg | ORAL_CAPSULE | Freq: Two times a day (BID) | ORAL | Status: DC

## 2017-10-17 MED ORDER — SODIUM CHLORIDE 0.9 % IV SOLN
250.0000 mL | INTRAVENOUS | Status: DC | PRN
Start: 2017-10-17 — End: 2017-10-17

## 2017-10-17 MED ORDER — LIDOCAINE HCL (PF) 1 % IJ SOLN
INTRAMUSCULAR | Status: DC | PRN
  Administered 2017-10-17: 2 mL via SUBCUTANEOUS

## 2017-10-17 MED ORDER — MIDAZOLAM HCL 2 MG/2ML IJ SOLN
INTRAMUSCULAR | Status: DC | PRN
  Administered 2017-10-17: 1 mg via INTRAVENOUS
  Administered 2017-10-17: 2 mg via INTRAVENOUS

## 2017-10-17 MED ORDER — ATORVASTATIN CALCIUM 40 MG PO TABS: 40.0000 mg | ORAL_TABLET | Freq: Every day | ORAL | Status: DC

## 2017-10-17 MED ORDER — CLOPIDOGREL BISULFATE 75 MG PO TABS: 75.0000 mg | ORAL_TABLET | Freq: Every day | ORAL | Status: DC

## 2017-10-17 MED ORDER — ANGIOPLASTY BOOK
Freq: Once | Status: DC
  Filled 2017-10-17: qty 1

## 2017-10-17 MED ORDER — VITAMIN D3 250 MCG (10000 UT) PO TABS: 5000.0000 [IU] | ORAL_TABLET | Freq: Every day | ORAL | Status: DC

## 2017-10-17 MED ORDER — LIDOCAINE HCL (PF) 1 % IJ SOLN
INTRAMUSCULAR | Status: AC
  Filled 2017-10-17: qty 30

## 2017-10-17 MED ORDER — METOPROLOL SUCCINATE ER 25 MG PO TB24: 25.0000 mg | ORAL_TABLET | Freq: Every day | ORAL | Status: DC

## 2017-10-17 MED ORDER — HYDRALAZINE HCL 20 MG/ML IJ SOLN: 5.0000 mg | INTRAMUSCULAR | Status: DC | PRN

## 2017-10-17 SURGICAL SUPPLY — 18 items
BALLN SAPPHIRE 1.5X10 (BALLOONS) ×2
BALLOON SAPPHIRE 1.5X10 (BALLOONS) ×1 IMPLANT
CATH LAUNCHER 6FR EBU3.5 (CATHETERS) ×2 IMPLANT
CATH OPTITORQUE TIG 4.0 5F (CATHETERS) ×2 IMPLANT
COVER PRB 48X5XTLSCP FOLD TPE (BAG) ×1 IMPLANT
COVER PROBE 5X48 (BAG) ×1
DEVICE RAD COMP TR BAND LRG (VASCULAR PRODUCTS) ×2 IMPLANT
GLIDESHEATH SLEND A-KIT 6F 22G (SHEATH) ×2 IMPLANT
GLIDESHEATH SLEND SS 6F .021 (SHEATH) ×2 IMPLANT
GUIDEWIRE INQWIRE 1.5J.035X260 (WIRE) ×1 IMPLANT
INQWIRE 1.5J .035X260CM (WIRE) ×2
KIT ENCORE 26 ADVANTAGE (KITS) ×2 IMPLANT
KIT HEART LEFT (KITS) ×2 IMPLANT
PACK CARDIAC CATHETERIZATION (CUSTOM PROCEDURE TRAY) ×2 IMPLANT
STENT SIERRA 2.25 X 12 MM (Permanent Stent) ×2 IMPLANT
TRANSDUCER W/STOPCOCK (MISCELLANEOUS) ×2 IMPLANT
TUBING CIL FLEX 10 FLL-RA (TUBING) ×2 IMPLANT
WIRE RUNTHROUGH .014X180CM (WIRE) ×2 IMPLANT

## 2017-10-17 NOTE — Research (Signed)
CADFEM Informed Consent   Subject Name: Mia Gonzales  Subject met inclusion and exclusion criteria.  The informed consent form, study requirements and expectations were reviewed with the subject and questions and concerns were addressed prior to the signing of the consent form.  The subject verbalized understanding of the trail requirements.  The subject agreed to participate in the CADFEM trial and signed the informed consent.  The informed consent was obtained prior to performance of any protocol-specific procedures for the subject.  A copy of the signed informed consent was given to the subject and a copy was placed in the subject's medical record.  [name] 10/17/2017, 06:46 AM

## 2017-10-17 NOTE — Discharge Instructions (Signed)

## 2017-10-17 NOTE — Progress Notes (Signed)
1105-1140 Pt seen by me in November. Brief review of ed done as she is sleepy. Reviewed purpose of plavix with stent, NTG use, ex ed , and heart healthy food choices. Pt has been referred to [facility] and completed orientation. Will need cardiology to notify when she can start CRP 2 program. Luetta Nutting RN BSN 10/17/2017 11:37 AM

## 2017-10-17 NOTE — Discharge Summary (Signed)
Discharge Summary    Patient ID: Mia Gonzales,  MRN: [id number], DOB/AGE: 68/18/1952 68 y.o.  Admit date: 10/17/2017 Discharge date: 10/17/2017  Primary Care Provider: Caffie Damme Primary Cardiologist: Dr. Duke Salvia  Discharge Diagnoses    Principal Problem:   Progressive angina [facility]) Active Problems:   Essential hypertension   Asthma   Diabetes mellitus type 2 in obese [facility])   Pure hypercholesterolemia   Allergies Allergies  Allergen Reactions  . Povidone-Iodine Swelling    Swelling locally when used around eye   . Tramadol-Acetaminophen     Other reaction(s): Vomiting  . Codeine Palpitations  . Fentanyl Itching    Diagnostic Studies/Procedures   CORONARY STENT INTERVENTION  LEFT HEART CATH AND CORONARY ANGIOGRAPHY  Conclusion     Mid-distal LAD lesion is 80% stenosed.  A drug-eluting stent was successfully placed using a STENT SIERRA 2.25 X 12 MM (2.4 MM post-dilation)  Post intervention, there is a 0% residual stenosis.  Dist LAD lesion is 99% stenosed.  Balloon angioplasty was performed using a BALLOON SAPPHIRE 1.5X10. Post intervention, there is a 45% residual stenosis with brisk flow.  Previously placed Mid RCA drug eluting stent is widely patent.  Mid Cx lesion is 25% stenosed. 1st Mrg lesion is 25% stenosed.  The left ventricular systolic function is normal. The left ventricular ejection fraction is 55-65% by visual estimate.  LV end diastolic pressure is normal.  There is no mitral valve regurgitation.  Post intervention, there is a 0% residual stenosis.  A drug-eluting stent was successfully placed using a STENT SIERRA 2.25 X 12 MM.   Patent RCA stent with progression of mLAD 20% to 80% & apical LAD 99% -> DES PCI of dLAD & PTCA of apical lesion. Normal LV Size & Function.  Plan:  Uncomplicated PCI of the LAD.  Patient is a candidate for same day discharge Continue aspirin Plavix for DAPT -minimum 1 year Increase Imdur to 30  mg Consider increasing statin in the outpatient setting.      Diagnostic Diagram       Post-Intervention Diagram           History of Present Illness     Mia Gonzales is a 67 y.o. female with hyperlipidemia, asthma, prior tobacco abuse, right renal cell carcinoma status post radical nephrectomy. CAD and diabetes presented for outpatient cath.   Hx of coronary artery disease status post drug-eluting stent to the mid RCA in November 2018 with nonobstructive disease elsewhere within the coronary tree.   she was complaining of severe fatigue and dyspnea on exertion.  We changed her Brilinta to Plavix and reloaded her 300 mg and then began 75 mg daily.  Metoprolol was changed to adjust dose from a.m. dose to help with daytime fatigue.  She called our office on 10/14/2017 with continued complaints of fatigue, recurrent chest pain, pain radiating from her mid sternal area to her back and right arm with associated shortness of breath, lasting only a few minutes.  She had one episode of chest discomfort that awoke her at night, she took nitroglycerin and the pain was resolved within 10 minutes.  She is here to discuss her ongoing symptoms.  She states that she has had recurrent chest pain some when she was out shopping which she described as pressure and ache substernal, and she has had 2 episodes over the last 2 nights when she was awakened by chest pain last night with the most severe.  She had to take nitroglycerin  each time which did cause her symptoms to subside.  The pain was radiating to her jaw shoulders and neck.   Hospital Course     Consultants: None  Presented for cath for ongoing angina pain. Cath report as above. S/p PTCA & DES to mid LAD and angioplasty only to distal LAD with 45% residual stenosis post intervention. Patent RCA stent. No recurrent pain. Increased imdur to 30mg  qd and lipitor to 80mg  qd. No post cath complications. Radial site stable without hematoma. Felt  stable for same day PCI. Continue ASA and plavix.   The patient has been seen by Dr. Herbie Baltimore today and deemed ready for discharge home. All follow-up appointments have been scheduled. Discharge medications are listed below.  _____________   Discharge Vitals Blood pressure 128/68, pulse (!) 57, temperature 98 F (36.7 C), temperature source Oral, resp. rate 14, height 5\' 7"  (1.702 m), weight 211 lb (95.7 kg), SpO2 99 %.  Filed Weights   10/17/17 0535  Weight: 211 lb (95.7 kg)   Physical Exam  Constitutional: She is oriented to person, place, and time and well-developed, well-nourished, and in no distress.  HENT:  Head: Normocephalic and atraumatic.  Eyes: EOM are normal. Pupils are equal, round, and reactive to light.  Neck: Normal range of motion. Neck supple.  Cardiovascular: Normal rate and regular rhythm.  No murmur heard. Pulmonary/Chest: Effort normal and breath sounds normal.  Abdominal: Soft. Bowel sounds are normal.  Musculoskeletal: Normal range of motion.  Neurological: She is alert and oriented to person, place, and time.  Skin: Skin is warm and dry.  Psychiatric: Affect normal.   Labs & Radiologic Studies     CBC Recent Labs    10/15/17 1022  WBC 10.0  HGB 13.7  HCT 41.1  MCV 85  PLT 261   Basic Metabolic Panel Recent Labs    96/29/52 1022  NA 140  K 4.9  CL 98  CO2 27  GLUCOSE 161*  BUN 17  CREATININE 0.96  CALCIUM 9.3    No results found.  Disposition   Pt is being discharged home today in good condition.  Follow-up Plans & Appointments    Follow-up Information    Jodelle Gross, NP. Go on 10/27/2017.   Specialties:  Nurse Practitioner, Radiology, Cardiology Why:  @11am  for post cath. Please arrive 15 mintues early  Contact information: [address] [place] [address] [phone]          Discharge Instructions    Diet - low sodium heart healthy   Complete by:  As directed    Discharge instructions    Complete by:  As directed    No driving for 48 hours. No lifting over 5 lbs for 1 week. No sexual activity for 1 week. You may return to work on 10/23/17. Keep procedure site clean & dry. If you notice increased pain, swelling, bleeding or pus, call/return!  You may shower, but no soaking baths/hot tubs/pools for 1 week.   Increase activity slowly   Complete by:  As directed       Discharge Medications   Allergies as of 10/17/2017      Reactions   Povidone-iodine Swelling   Swelling locally when used around eye   Tramadol-acetaminophen    Other reaction(s): Vomiting   Codeine Palpitations   Fentanyl Itching      Medication List    TAKE these medications   aspirin EC 81 MG tablet Take 81 mg by mouth daily.  atorvastatin 80 MG tablet Commonly known as:  LIPITOR Take 1 tablet (80 mg total) by mouth daily at 6 PM. What changed:    medication strength  how much to take   clopidogrel 75 MG tablet Commonly known as:  PLAVIX Take 1 tablet (75 mg total) by mouth daily. START Friday TAKE 300MG  (4 TABS-75MG ) THEN 1 TAB-75MG  THEREAFTER STARTING SATURDAY What changed:  additional instructions   CoQ10 100 MG Caps Take 1 capsule by mouth daily.   gabapentin 300 MG capsule Commonly known as:  NEURONTIN Take 300 mg by mouth 2 (two) times daily.   isosorbide mononitrate 30 MG 24 hr tablet Commonly known as:  IMDUR Take 1 tablet (30 mg total) by mouth daily. What changed:  how much to take   metoprolol succinate 25 MG 24 hr tablet Commonly known as:  TOPROL-XL Take 1 tablet (25 mg total) by mouth daily.   nitroGLYCERIN 0.4 MG SL tablet Commonly known as:  NITROSTAT Place 1 tablet (0.4 mg total) under the tongue every 5 (five) minutes as needed for chest pain.   venlafaxine XR 75 MG 24 hr capsule Commonly known as:  EFFEXOR-XR Take 75 mg by mouth 2 (two) times daily.   Vitamin D3 10000 units Tabs Take 5,000 Units by mouth daily.          Outstanding Labs/Studies    LFT and lipid panel in 6 weeks as statin increase this admission  Duration of Discharge Encounter   Greater than 30 minutes including physician time.  Signed, Sharrell Ku Pricsilla Lindvall PA-C 10/17/2017, 2:09 PM

## 2017-10-17 NOTE — Interval H&P Note (Signed)
History and Physical Interval Note:  10/17/2017 7:26 AM  Mia Gonzales  has presented today for surgery, with the diagnosis of (progressive) unstable angina, known CAD.  The various methods of treatment have been discussed with the patient and family. After consideration of risks, benefits and other options for treatment, the patient has consented to  Procedure(s): LEFT HEART CATH AND CORONARY ANGIOGRAPHY (N/A) with possible PERCUTANEOUS CORONARY INTERVENTION as a surgical intervention .  The patient's history has been reviewed, patient examined, no change in status, stable for surgery.  I have reviewed the patient's chart and labs.  Questions were answered to the patient's satisfaction.    Cath Lab Visit (complete for each Cath Lab visit)  Clinical Evaluation Leading to the Procedure:   ACS: No.- PROGRESSIVE EXERTIONAL ANGINA  Non-ACS:    Anginal Classification: CCS III-IV  Anti-ischemic medical therapy: Minimal Therapy (1 class of medications)  Non-Invasive Test Results: No non-invasive testing performed  Prior CABG: No previous CABG   Bryan Lemma

## 2017-10-19 ENCOUNTER — Encounter (HOSPITAL_COMMUNITY): Payer: Self-pay | Admitting: Cardiology

## 2017-10-20 ENCOUNTER — Encounter (HOSPITAL_COMMUNITY): Payer: Medicare HMO

## 2017-10-22 ENCOUNTER — Encounter (HOSPITAL_COMMUNITY): Payer: Medicare HMO

## 2017-10-22 ENCOUNTER — Telehealth (HOSPITAL_COMMUNITY): Payer: Self-pay | Admitting: *Deleted

## 2017-10-24 ENCOUNTER — Encounter (HOSPITAL_COMMUNITY): Payer: Medicare HMO

## 2017-10-26 NOTE — Progress Notes (Signed)
Cardiology Office Note   Date:  10/27/2017   ID:  Mia Gonzales, DOB [birth year], MRN [id number]  PCP:  Caffie Damme, MD  Cardiologist:  Dr.Randoph  Chief Complaint  Patient presents with  . Follow-up    Post cath     History of Present Illness: Mia Gonzales is a 67 y.o. female who presents for ongoing assessment and management of coronary artery disease, status post drug-eluting stent to the mid RCA in November 2018 with nonobstructive disease elsewhere within the coronary tree, hyperlipidemia, history of prior tobacco abuse, history of right renal carcinoma status post radical nephrectomy, and diabetes. Due to recurrent symptoms of DOE and chest pressure, she was scheduled for OP cardiac catheterization.  CORONARY STENT INTERVENTION  LEFT HEART CATH AND CORONARY ANGIOGRAPHY  Conclusion     Mid-distal LAD lesion is 80% stenosed.  A drug-eluting stent was successfully placed using a STENT SIERRA 2.25 X 12 MM (2.4 MM post-dilation)  Post intervention, there is a 0% residual stenosis.  Dist LAD lesion is 99% stenosed.  Balloon angioplasty was performed using a BALLOON SAPPHIRE 1.5X10. Post intervention, there is a 45% residual stenosis with brisk flow.  Previously placed Mid RCA drug eluting stent is widely patent.  Mid Cx lesion is 25% stenosed. 1st Mrg lesion is 25% stenosed.  The left ventricular systolic function is normal. The left ventricular ejection fraction is 55-65% by visual estimate.  LV end diastolic pressure is normal.  There is no mitral valve regurgitation.  Post intervention, there is a 0% residual stenosis.  A drug-eluting stent was successfully placed using a STENT SIERRA 2.25 X 12 MM.  Patent RCA stent with progression of mLAD 20% to 80% & apical LAD 99% -> DES PCI of dLAD & PTCA of apical lesion. Normal LV Size & Function.  Plan:  Uncomplicated PCI of the LAD. Patient is a candidate for same day discharge Continue aspirin Plavix for DAPT -minimum 1  year Increase Imdur to 30 mg Consider increasing statin in the outpatient setting.   She comes today without any cardiac complaints. She is feeling great able to walk without shortness of breath or discomfort. She is tolerating the addition of the isosorbide nitrate 30 mg, and atorvastatin 40 mg increased to atorvastatin 80 mg. She does continue to have a headache with the addition of isosorbide, and is taking Tylenol which helps.  She is considering moving home to [facility] where her family is located. They are urging her to come back to be closer to them. If she moves, it will probably be late May. She has not yet made her decision. She is here to begin cardiac rehabilitation.   Past Medical History:  Diagnosis Date  . Asthma 01/06/2017  . Coronary artery disease   . Essential hypertension 01/06/2017  . Fibromyalgia   . Pleuritic chest pain 01/06/2017  . Shortness of breath 01/06/2017    Past Surgical History:  Procedure Laterality Date  . CARDIAC CATHETERIZATION  08/18/2017   S/P DES RCA  Dr Eldridge Dace  . CHOLECYSTECTOMY    . CORONARY STENT INTERVENTION N/A 08/18/2017   Procedure: CORONARY STENT INTERVENTION;  Surgeon: Corky Crafts, MD;  Location: [facility] INVASIVE CV LAB;  Service: Cardiovascular;  Laterality: N/A;  . CORONARY STENT INTERVENTION N/A 10/17/2017   Procedure: CORONARY STENT INTERVENTION;  Surgeon: Marykay Lex, MD;  Location: [facility] INVASIVE CV LAB;  Service: Cardiovascular;  Laterality: N/A;  . LEFT HEART CATH AND CORONARY ANGIOGRAPHY N/A 08/18/2017   Procedure: LEFT HEART  CATH AND CORONARY ANGIOGRAPHY;  Surgeon: Corky Crafts, MD;  Location: [facility] INVASIVE CV LAB;  Service: Cardiovascular;  Laterality: N/A;  . LEFT HEART CATH AND CORONARY ANGIOGRAPHY N/A 10/17/2017   Procedure: LEFT HEART CATH AND CORONARY ANGIOGRAPHY;  Surgeon: Marykay Lex, MD;  Location: [facility] INVASIVE CV LAB;  Service: Cardiovascular;  Laterality: N/A;  . NEPHRECTOMY       Current  Outpatient Medications  Medication Sig Dispense Refill  . aspirin EC 81 MG tablet Take 81 mg by mouth daily.    Marland Kitchen atorvastatin (LIPITOR) 80 MG tablet Take 1 tablet (80 mg total) by mouth daily at 6 PM. 90 tablet 3  . Cholecalciferol (VITAMIN D3) 10000 units TABS Take 5,000 Units by mouth daily.    . clopidogrel (PLAVIX) 75 MG tablet Take 1 tablet (75 mg total) by mouth daily. START Friday TAKE 300MG  (4 TABS-75MG ) THEN 1 TAB-75MG  THEREAFTER STARTING SATURDAY (Patient taking differently: Take 75 mg by mouth daily. ) 33 tablet 3  . gabapentin (NEURONTIN) 300 MG capsule Take 300 mg by mouth 2 (two) times daily.     . isosorbide mononitrate (IMDUR) 30 MG 24 hr tablet Take 1 tablet (30 mg total) by mouth daily. 30 tablet 6  . metoprolol succinate (TOPROL-XL) 25 MG 24 hr tablet Take 1 tablet (25 mg total) by mouth daily. 90 tablet 1  . nitroGLYCERIN (NITROSTAT) 0.4 MG SL tablet Place 1 tablet (0.4 mg total) under the tongue every 5 (five) minutes as needed for chest pain. 25 tablet 12  . venlafaxine XR (EFFEXOR-XR) 75 MG 24 hr capsule Take 75 mg by mouth 2 (two) times daily.     . Coenzyme Q10 (COQ10) 100 MG CAPS Take 1 capsule by mouth daily. 30 each    No current facility-administered medications for this visit.     Allergies:   Povidone-iodine; Tramadol-acetaminophen; Codeine; and Fentanyl    Social History:  The patient  reports that she has quit smoking. she has never used smokeless tobacco. She reports that she does not drink alcohol or use drugs.   Family History:  The patient's family history includes CAD in her sister; Cancer in her father and mother; Cardiomyopathy in her sister; Diabetes in her father and paternal grandmother; Heart attack in her maternal grandfather and sister; Heart disease in her maternal grandfather and maternal grandmother; Rheum arthritis in her mother.    ROS: All other systems are reviewed and negative. Unless otherwise mentioned in H&P    PHYSICAL EXAM: VS:   Ht 5\' 7"  (1.702 m)   Wt 213 lb (96.6 kg)   BMI 33.36 kg/m  , BMI Body mass index is 33.36 kg/m. GEN: Well nourished, well developed, in no acute distress  HEENT: normal  Neck: no JVD, carotid bruits, or masses Cardiac: RRR; no murmurs, rubs, or gallops,no edema  Respiratory:  clear to auscultation bilaterally, normal work of breathing GI: soft, nontender, nondistended, + BS MS: no deformity or atrophy right wrist catheterization insertion site is well-healed. She does have ecchymosis which is fading, from IV sites in her brachial area of her arms. Skin: warm and dry, no rash Neuro:  Strength and sensation are intact Psych: euthymic mood, full affect   EKG: Normal sinus rhythm heart rate of 63 bpm, T-wave flattening in inferior laterally.  Recent Labs: 08/17/2017: ALT 18 10/15/2017: BUN 17; Creatinine, Ser 0.96; Hemoglobin 13.7; Platelets 261; Potassium 4.9; Sodium 140    Lipid Panel    Component Value Date/Time   CHOL  205 (H) 08/17/2017 0015   TRIG 267 (H) 08/17/2017 0015   HDL 31 (L) 08/17/2017 0015   CHOLHDL 6.6 08/17/2017 0015   VLDL 53 (H) 08/17/2017 0015   LDLCALC 121 (H) 08/17/2017 0015      Wt Readings from Last 3 Encounters:  10/27/17 213 lb (96.6 kg)  10/17/17 211 lb (95.7 kg)  10/15/17 212 lb 9.6 oz (96.4 kg)      Other studies Reviewed: Cardiac Cath 08/18/2017 Conclusion     Mid RCA lesion is 99% stenosed. A drug-eluting stent was successfully placed using a STENT SIERRA 3.00 X 38 MM, postdilated to > 3.5 mm.  Post intervention, there is a 0% residual stenosis.  1st Mrg lesion is 25% stenosed.  Mid Cx lesion is 25% stenosed.  Mid LAD lesion is 20% stenosed.  The left ventricular ejection fraction is 55-65% by visual estimate.  LV end diastolic pressure is mildly elevated. LVEDP 21 mm Hg.  The left ventricular systolic function is normal.  There is no aortic valve stenosis.   Nonobstructive disease in the left system.  Continue dual  antiplatelet therapy for one year with aggressive secondary prevention.     Echocardiogram 08/19/2017 Left ventricle: The cavity size was normal. Wall thickness was   increased in a pattern of moderate LVH. Systolic function was   vigorous. The estimated ejection fraction was in the range of 65%   to 70%. Wall motion was normal; there were no regional wall   motion abnormalities. Doppler parameters are consistent with   abnormal left ventricular relaxation (grade 1 diastolic   dysfunction). The E/e&' ratio is between 8-15, suggesting   indeterminate LV filling pressure. - Aortic valve: Trileaflet. Sclerosis without stenosis. There was   no regurgitation. - Mitral valve: Mildly thickened leaflets . There was trivial   regurgitation. - Left atrium: The atrium was normal in size. - Inferior vena cava: The vessel was normal in size. The   respirophasic diameter changes were in the normal range (>= 50%),   consistent with normal central venous pressure.  Impressions:  - LVEF 65-70%, moderate LVH, normal wall motion, grade 1 DD,   indeterminate LV filling pressure, aortic valve sclerosis,   trivial MR, normal LA size, normal IVC.   ASSESSMENT AND PLAN:  1.  Coronary artery disease: Patient had repeat cardiac catheterization 10/17/2017. Recurrent symptoms. He was found that her LAD lesion progress from 25% to 99%, and required PCI mid LAD with atherectomy did very well. She's feeling much better has got her energy back pain free and has no further complaints of chest pain.  Her only complaint is headache with isosorbide. I've advised her to take Tylenol around-the-clock for the next several weeks, and her body will get used to it soon. She will notify us if this is not the case. I have given her refills she was only given 30 day supply. She should continue aspirin and Plavix indefinitely. She remains on beta blocker therapy. She is not currently on ACE inhibitor, there is been no evidence  of remodeling per echocardiogram, with normal LV systolic function.  She is released to begin cardiac rehabilitation, which is going to begin on October 29, 2017. She is eager to begin.   2. Hypercholesterolemia: Atorvastatin has been increased from 40 mg daily to 80 mg daily. She is not taking CoQ10, over-the-counter. She offers no complains of myalgias.  3. Hypertension: Blood pressure is well-controlled currently. She remains on isosorbide along with metoprolol. Succinate 25 mg daily.  Current medicines are reviewed at length with the patient today.  Questions answered concerning nitrate therapy and also need to begin cardiac rehabilitation.  Labs/ tests ordered today include:None  Bettey Mare. Liborio Nixon, ANP, AACC   10/27/2017 11:01 AM    [facility] [address], [place], [place] [address] Phone: [phone]; Fax: [phone]

## 2017-10-27 ENCOUNTER — Ambulatory Visit: Payer: Medicare HMO | Admitting: Adult Health

## 2017-10-27 ENCOUNTER — Encounter: Payer: Self-pay | Admitting: Adult Health

## 2017-10-27 ENCOUNTER — Encounter (HOSPITAL_COMMUNITY): Payer: Medicare HMO

## 2017-10-27 VITALS — BP 128/78 | HR 63 | Ht 67.0 in | Wt 213.0 lb

## 2017-10-27 DIAGNOSIS — I214 Non-ST elevation (NSTEMI) myocardial infarction: Secondary | ICD-10-CM | POA: Diagnosis not present

## 2017-10-27 DIAGNOSIS — I200 Unstable angina: Secondary | ICD-10-CM

## 2017-10-27 DIAGNOSIS — E7800 Pure hypercholesterolemia, unspecified: Secondary | ICD-10-CM

## 2017-10-27 DIAGNOSIS — I10 Essential (primary) hypertension: Secondary | ICD-10-CM

## 2017-10-27 DIAGNOSIS — I25110 Atherosclerotic heart disease of native coronary artery with unstable angina pectoris: Secondary | ICD-10-CM | POA: Diagnosis not present

## 2017-10-27 MED ORDER — METOPROLOL SUCCINATE ER 25 MG PO TB24: 25.0000 mg | ORAL_TABLET | Freq: Every day | ORAL | 1 refills | Status: DC

## 2017-10-27 MED ORDER — ISOSORBIDE MONONITRATE ER 30 MG PO TB24: 30.0000 mg | ORAL_TABLET | Freq: Every day | ORAL | 1 refills | Status: DC

## 2017-10-27 MED ORDER — CLOPIDOGREL BISULFATE 75 MG PO TABS: 75.0000 mg | ORAL_TABLET | Freq: Every day | ORAL | 1 refills | Status: AC

## 2017-10-27 MED ORDER — ATORVASTATIN CALCIUM 80 MG PO TABS: 80.0000 mg | ORAL_TABLET | Freq: Every day | ORAL | 1 refills | Status: AC

## 2017-10-27 NOTE — Patient Instructions (Signed)
Medication Instructions:  NO CHANGES-Your physician recommends that you continue on your current medications as directed. Please refer to the Current Medication list given to you today.  If you need a refill on your cardiac medications before your next appointment, please call your pharmacy.  4:00pm; and is closed for lunch from 12:45p-1:45pm   Special Instructions: OK TO START CARDIAC REHAB  Follow-Up: Your physician wants you to follow-up in: 3 MONTHS WITH DR Moreno [place] -OR- Joni Reining, DNP.    Thank you for choosing [facility] at [facility]!!

## 2017-10-29 ENCOUNTER — Encounter (HOSPITAL_COMMUNITY)
Admission: RE | Admit: 2017-10-29 | Discharge: 2017-10-29 | Disposition: A | Discharge status: Home / Self Care | Payer: Medicare HMO | Source: Ambulatory Visit | Attending: Cardiovascular Disease | Admitting: Cardiovascular Disease

## 2017-10-29 DIAGNOSIS — Z955 Presence of coronary angioplasty implant and graft: Secondary | ICD-10-CM

## 2017-10-29 DIAGNOSIS — I214 Non-ST elevation (NSTEMI) myocardial infarction: Secondary | ICD-10-CM | POA: Insufficient documentation

## 2017-10-29 NOTE — Progress Notes (Signed)
Daily Session Note  Patient Details  Name: Mia Gonzales MRN: [id number] Date of Birth: Aug 04, 1951 Referring Provider:     CARDIAC REHAB PHASE II ORIENTATION from 10/02/2017 in [place]  Referring Provider  Skeet Latch MD      Encounter Date: 10/29/2017  Check In: Session Check In - 10/29/17 1519      Check-In   Location  [facility]    Staff Present  Andi Hence, RN, Marga Melnick, RN, Mosie Epstein, MS,ACSM CEP, Exercise Physiologist;Olinty Celesta Aver, MS, ACSM CEP, Exercise Physiologist    Supervising physician immediately available to respond to emergencies  [facility] immediately available    Physician(s)  Dr. Tyrell Antonio    Medication changes reported      No    Fall or balance concerns reported     No    Tobacco Cessation  No Change    Warm-up and Cool-down  Performed as group-led instruction    Resistance Training Performed  No    VAD Patient?  No      Pain Assessment   Currently in Pain?  No/denies       Capillary Blood Glucose: No results found for this or any previous visit (from the past 24 hour(s)).    Social History   Tobacco Use  Smoking Status Former Smoker  Smokeless Tobacco Never Used    Goals Met:  Exercise tolerated well  Goals Unmet:  Not Applicable  Comments: Pt started cardiac rehab today.  Pt tolerated light exercise without difficulty. VSS, telemetry-Sinus Rhtyhm, asymptomatic.  Medication list reconciled. Pt denies barriers to medicaiton compliance.  PSYCHOSOCIAL ASSESSMENT:  PHQ-0. Pt exhibits positive coping skills, hopeful outlook with supportive family. No psychosocial needs identified at this time, no psychosocial interventions necessary.    Pt enjoys shopping and walking her dogs..   Pt oriented to exercise equipment and routine.verbalized understanding. Mia Gonzales reports that she has had a slight headache since has been taking the Imdur once a day. Mia Gonzales says she is taking tylenol  every 6 hours per Bunnie Domino DNP . Will continue to monitor the patient throughout  the program.  .Barnet Pall, RN,BSN 10/29/2017 4:27 PM   Dr. Fransico Him is Medical Director for Cardiac Rehab at [facility].

## 2017-10-31 ENCOUNTER — Encounter (HOSPITAL_COMMUNITY)
Admission: RE | Admit: 2017-10-31 | Discharge: 2017-10-31 | Disposition: A | Discharge status: Home / Self Care | Payer: Medicare HMO | Source: Ambulatory Visit | Attending: Cardiovascular Disease | Admitting: Cardiovascular Disease

## 2017-10-31 DIAGNOSIS — I214 Non-ST elevation (NSTEMI) myocardial infarction: Secondary | ICD-10-CM | POA: Diagnosis not present

## 2017-10-31 DIAGNOSIS — Z955 Presence of coronary angioplasty implant and graft: Secondary | ICD-10-CM

## 2017-11-03 ENCOUNTER — Encounter (HOSPITAL_COMMUNITY)
Admission: RE | Admit: 2017-11-03 | Discharge: 2017-11-03 | Disposition: A | Discharge status: Home / Self Care | Payer: Medicare HMO | Source: Ambulatory Visit | Attending: Cardiovascular Disease | Admitting: Cardiovascular Disease

## 2017-11-03 DIAGNOSIS — I214 Non-ST elevation (NSTEMI) myocardial infarction: Secondary | ICD-10-CM

## 2017-11-03 DIAGNOSIS — Z955 Presence of coronary angioplasty implant and graft: Secondary | ICD-10-CM

## 2017-11-04 NOTE — Progress Notes (Signed)
Reviewed home exercise guidelines with patient including endpoints, temperature precautions, target heart rate and rate of perceived exertion. Pt plans to walk as her mode of home exercise. Pt voices understanding of instructions given. Macon Lesesne M Jordy Verba, MS, ACSM CEP  

## 2017-11-05 ENCOUNTER — Encounter (HOSPITAL_COMMUNITY)
Admission: RE | Admit: 2017-11-05 | Discharge: 2017-11-05 | Disposition: A | Discharge status: Home / Self Care | Payer: Medicare HMO | Source: Ambulatory Visit | Attending: Cardiovascular Disease | Admitting: Cardiovascular Disease

## 2017-11-05 DIAGNOSIS — I214 Non-ST elevation (NSTEMI) myocardial infarction: Secondary | ICD-10-CM | POA: Diagnosis not present

## 2017-11-05 DIAGNOSIS — Z955 Presence of coronary angioplasty implant and graft: Secondary | ICD-10-CM

## 2017-11-07 ENCOUNTER — Encounter (HOSPITAL_COMMUNITY)
Admission: RE | Admit: 2017-11-07 | Discharge: 2017-11-07 | Disposition: A | Discharge status: Home / Self Care | Payer: Medicare HMO | Source: Ambulatory Visit | Attending: Cardiovascular Disease | Admitting: Cardiovascular Disease

## 2017-11-07 VITALS — BP 108/70 | HR 84 | Wt 215.8 lb

## 2017-11-07 DIAGNOSIS — I214 Non-ST elevation (NSTEMI) myocardial infarction: Secondary | ICD-10-CM | POA: Diagnosis not present

## 2017-11-07 DIAGNOSIS — Z955 Presence of coronary angioplasty implant and graft: Secondary | ICD-10-CM

## 2017-11-07 LAB — COMPREHENSIVE METABOLIC PANEL
ALT: 9 IU/L (ref 0–32)
AST: 14 IU/L (ref 0–40)
Albumin/Globulin Ratio: 1.7 (ref 1.2–2.2)
Albumin: 4.4 g/dL (ref 3.6–4.8)
Alkaline Phosphatase: 116 IU/L (ref 39–117)
BILIRUBIN TOTAL: 0.3 mg/dL (ref 0.0–1.2)
BUN/Creatinine Ratio: 17 (ref 12–28)
BUN: 21 mg/dL (ref 8–27)
CALCIUM: 9.5 mg/dL (ref 8.7–10.3)
CHLORIDE: 101 mmol/L (ref 96–106)
CO2: 24 mmol/L (ref 20–29)
Creatinine, Ser: 1.24 mg/dL — ABNORMAL HIGH (ref 0.57–1.00)
GFR, EST AFRICAN AMERICAN: 52 mL/min/{1.73_m2} — AB (ref >59)
GFR, EST NON AFRICAN AMERICAN: 45 mL/min/{1.73_m2} — AB (ref >59)
GLUCOSE: 149 mg/dL — AB (ref 65–99)
Globulin, Total: 2.6 g/dL (ref 1.5–4.5)
Potassium: 4.6 mmol/L (ref 3.5–5.2)
Sodium: 142 mmol/L (ref 134–144)
TOTAL PROTEIN: 7 g/dL (ref 6.0–8.5)

## 2017-11-07 LAB — LIPID PANEL
CHOL/HDL RATIO: 3.5 ratio (ref 0.0–4.4)
Cholesterol, Total: 112 mg/dL (ref 100–199)
HDL: 32 mg/dL — ABNORMAL LOW (ref >39)
LDL Calculated: 30 mg/dL (ref 0–99)
Triglycerides: 252 mg/dL — ABNORMAL HIGH (ref 0–149)
VLDL Cholesterol Cal: 50 mg/dL — ABNORMAL HIGH (ref 5–40)

## 2017-11-10 ENCOUNTER — Encounter (HOSPITAL_COMMUNITY): Payer: Medicare HMO

## 2017-11-10 ENCOUNTER — Telehealth (HOSPITAL_COMMUNITY): Payer: Self-pay | Admitting: Family Medicine

## 2017-11-11 ENCOUNTER — Ambulatory Visit: Payer: Medicare HMO | Admitting: Adult Health

## 2017-11-11 ENCOUNTER — Encounter: Payer: Self-pay | Admitting: Adult Health

## 2017-11-11 ENCOUNTER — Telehealth: Payer: Self-pay | Admitting: Adult Health

## 2017-11-11 VITALS — BP 122/68 | HR 80 | Ht 67.0 in | Wt 212.6 lb

## 2017-11-11 DIAGNOSIS — I10 Essential (primary) hypertension: Secondary | ICD-10-CM | POA: Diagnosis not present

## 2017-11-11 DIAGNOSIS — Z79899 Other long term (current) drug therapy: Secondary | ICD-10-CM | POA: Diagnosis not present

## 2017-11-11 DIAGNOSIS — E7800 Pure hypercholesterolemia, unspecified: Secondary | ICD-10-CM | POA: Diagnosis not present

## 2017-11-11 DIAGNOSIS — I200 Unstable angina: Secondary | ICD-10-CM | POA: Diagnosis not present

## 2017-11-11 DIAGNOSIS — I25118 Atherosclerotic heart disease of native coronary artery with other forms of angina pectoris: Secondary | ICD-10-CM | POA: Diagnosis not present

## 2017-11-11 MED ORDER — ISOSORBIDE MONONITRATE ER 60 MG PO TB24: 60.0000 mg | ORAL_TABLET | Freq: Every day | ORAL | 3 refills | Status: AC

## 2017-11-11 NOTE — Progress Notes (Signed)
Cardiology Office Note   Date:  11/11/2017   ID:  Mia Gonzales, DOB Nov 24, 1950, MRN [id number]  PCP:  Caffie Damme, MD  Cardiologist:  Dr. Herbie Baltimore  Chief Complaint  Patient presents with  . Chest Pain     History of Present Illness: Mia Gonzales is a 67 y.o. female who presents forongoing assessment and management of coronary artery disease, status post drug-eluting stent to the mid RCA in November 2018 with nonobstructive disease elsewhere within the coronary tree, hyperlipidemia, history of prior tobacco abuse, history of right renal carcinoma status post radical nephrectomy, and diabetes. Due to recurrent symptoms of DOE and chest pressure, she was scheduled for OP cardiac catheterization.  CORONARY STENT INTERVENTION 10/17/2017-[facility]   LEFT HEART CATH AND CORONARY ANGIOGRAPHY  Conclusion     Mid-distal LAD lesion is 80% stenosed.  A drug-eluting stent was successfully placed using a STENT SIERRA 2.25 X 12 MM (2.4 MM post-dilation)  Post intervention, there is a 0% residual stenosis.  Dist LAD lesion is 99% stenosed.  Balloon angioplasty was performed using a BALLOON SAPPHIRE 1.5X10. Post intervention, there is a 45% residual stenosis with brisk flow.  Previously placed Mid RCA drug eluting stent is widely patent.  Mid Cx lesion is 25% stenosed. 1st Mrg lesion is 25% stenosed.  The left ventricular systolic function is normal. The left ventricular ejection fraction is 55-65% by visual estimate.  LV end diastolic pressure is normal.  There is no mitral valve regurgitation.  Post intervention, there is a 0% residual stenosis.  A drug-eluting stent was successfully placed using a STENT SIERRA 2.25 X 12 MM.  Patent RCA stent with progression of mLAD 20% to 80% & apical LAD 99% -> DES PCI of dLAD & PTCA of apical lesion.   She comes today with recurrent symptoms. She had been going to cardiac rehabilitation; on Friday, Feb 15th, 2019 she began to have discomfort in her  chest radiating down her right arm and into her right jaw while exercising. He was unable to complete her exercise. She became very fatigued Friday afternoon and slept most of the day Saturday. She states that she's he recurrent complaints of discomfort jaw pain and right arm pain exactly like she was feeling her to her stent placement in January 2019. She chose to ignore this, until her sister insisted that she come to the office today to be evaluated.  She states just walking back to the exam room today she was exhausted. She is complaining of some right jaw pain and some pain in the pocket of her neck currently. She has been medically compliant and has been taking isosorbide as directed as well. The pain does not occur with deep breathing.   Past Medical History:  Diagnosis Date  . Asthma 01/06/2017  . Coronary artery disease   . Essential hypertension 01/06/2017  . Fibromyalgia   . Pleuritic chest pain 01/06/2017  . Shortness of breath 01/06/2017    Past Surgical History:  Procedure Laterality Date  . CARDIAC CATHETERIZATION  08/18/2017   S/P DES RCA  Dr Eldridge Dace  . CHOLECYSTECTOMY    . CORONARY STENT INTERVENTION N/A 08/18/2017   Procedure: CORONARY STENT INTERVENTION;  Surgeon: Corky Crafts, MD;  Location: [facility] INVASIVE CV LAB;  Service: Cardiovascular;  Laterality: N/A;  . CORONARY STENT INTERVENTION N/A 10/17/2017   Procedure: CORONARY STENT INTERVENTION;  Surgeon: Marykay Lex, MD;  Location: [facility] INVASIVE CV LAB;  Service: Cardiovascular;  Laterality: N/A;  . LEFT HEART CATH AND  CORONARY ANGIOGRAPHY N/A 08/18/2017   Procedure: LEFT HEART CATH AND CORONARY ANGIOGRAPHY;  Surgeon: Corky Crafts, MD;  Location: [facility] INVASIVE CV LAB;  Service: Cardiovascular;  Laterality: N/A;  . LEFT HEART CATH AND CORONARY ANGIOGRAPHY N/A 10/17/2017   Procedure: LEFT HEART CATH AND CORONARY ANGIOGRAPHY;  Surgeon: Marykay Lex, MD;  Location: [facility] INVASIVE CV LAB;  Service: Cardiovascular;   Laterality: N/A;  . NEPHRECTOMY       Current Outpatient Medications  Medication Sig Dispense Refill  . aspirin EC 81 MG tablet Take 81 mg by mouth daily.    Marland Kitchen atorvastatin (LIPITOR) 80 MG tablet Take 1 tablet (80 mg total) by mouth daily at 6 PM. 90 tablet 1  . Cholecalciferol (VITAMIN D3) 10000 units TABS Take 5,000 Units by mouth daily.    . clopidogrel (PLAVIX) 75 MG tablet Take 1 tablet (75 mg total) by mouth daily. 90 tablet 1  . Coenzyme Q10 (COQ10) 100 MG CAPS Take 1 capsule by mouth daily. 30 each   . gabapentin (NEURONTIN) 300 MG capsule Take 300 mg by mouth 2 (two) times daily.     . isosorbide mononitrate (IMDUR) 60 MG 24 hr tablet Take 1 tablet (60 mg total) by mouth daily. 30 tablet 3  . metoprolol succinate (TOPROL-XL) 25 MG 24 hr tablet Take 1 tablet (25 mg total) by mouth daily. 90 tablet 1  . nitroGLYCERIN (NITROSTAT) 0.4 MG SL tablet Place 1 tablet (0.4 mg total) under the tongue every 5 (five) minutes as needed for chest pain. 25 tablet 12  . venlafaxine XR (EFFEXOR-XR) 75 MG 24 hr capsule Take 75 mg by mouth 2 (two) times daily.      No current facility-administered medications for this visit.     Allergies:   Povidone-iodine; Tramadol-acetaminophen; Codeine; and Fentanyl    Social History:  The patient  reports that she has quit smoking. she has never used smokeless tobacco. She reports that she does not drink alcohol or use drugs.   Family History:  The patient's family history includes CAD in her sister; Cancer in her father and mother; Cardiomyopathy in her sister; Diabetes in her father and paternal grandmother; Heart attack in her maternal grandfather and sister; Heart disease in her maternal grandfather and maternal grandmother; Rheum arthritis in her mother.    ROS: All other systems are reviewed and negative. Unless otherwise mentioned in H&P    PHYSICAL EXAM: VS:  BP 122/68   Pulse 80   Ht 5\' 7"  (1.702 m)   Wt 212 lb 9.6 oz (96.4 kg)   BMI 33.30  kg/m  , BMI Body mass index is 33.3 kg/m. GEN: Well nourished, well developed, in no acute distress  HEENT: normal  Neck: no JVD, carotid bruits, or masses Cardiac: RRR; no murmurs, rubs, or gallops,no edema  Respiratory:  clear to auscultation bilaterally, normal work of breathing GI: soft, nontender, nondistended, + BS MS: no deformity or atrophy  Skin: warm and dry, no rash Neuro:  Strength and sensation are intact Psych: euthymic mood, full affect   EKG: Normal sinus rhythm with nonspecific ST-T wave abnormality in the lateral leads, and also in the shoe in V3. Heart rate of 80 bpm.  Recent Labs: 10/15/2017: Hemoglobin 13.7; Platelets 261 11/06/2017: ALT 9; BUN 21; Creatinine, Ser 1.24; Potassium 4.6; Sodium 142    Lipid Panel    Component Value Date/Time   CHOL 112 11/06/2017 1448   TRIG 252 (H) 11/06/2017 1448   HDL 32 (L)  11/06/2017 1448   CHOLHDL 3.5 11/06/2017 1448   CHOLHDL 6.6 08/17/2017 0015   VLDL 53 (H) 08/17/2017 0015   LDLCALC 30 11/06/2017 1448      Wt Readings from Last 3 Encounters:  11/11/17 212 lb 9.6 oz (96.4 kg)  10/27/17 213 lb (96.6 kg)  10/17/17 211 lb (95.7 kg)      Other studies Reviewed: Cardiac Cath 10/18/2016-[facility] Conclusion     Mid RCA lesion is 99% stenosed. A drug-eluting stent was successfully placed using a STENT SIERRA 3.00 X 38 MM, postdilated to > 3.5 mm.  Post intervention, there is a 0% residual stenosis.  1st Mrg lesion is 25% stenosed.  Mid Cx lesion is 25% stenosed.  Mid LAD lesion is 20% stenosed.  The left ventricular ejection fraction is 55-65% by visual estimate.  LV end diastolic pressure is mildly elevated. LVEDP 21 mm Hg.  The left ventricular systolic function is normal.  There is no aortic valve stenosis.   Nonobstructive disease in the left system.  Continue dual antiplatelet therapy for one year with aggressive secondary prevention.       ASSESSMENT AND PLAN:  1.  Recurrent chest pain:  Due to known history of CAD with recent stent placement to the distal LAD, on 10/17/2017, with prior stent to the right coronary artery in November 2018, with progressive disease between November and January 2019, I'm concerned she may have progressive disease, and failure, versus coronary artery spasms.  I have discussed this with Dr. Herbie Baltimore who is Onsite today. He is reviewed both cardiac catheterization reports. It is his recommendation she have a repeat cardiac catheterization for further evaluation of coronary anatomy for evaluation of progressive disease or stent failure.  I have discussed this with the patient is surprised that she may need to be restudied, but is willing to proceed with this. The patient is scheduled for Wednesday, 11/12/2017 with Dr. Herbie Baltimore. She does not need new labs she has had recent labs on 11/06/2017. A repeat INR and CBC will be ordered as they had not been completed recently.  The patient understands that risks include but are not limited to stroke (1 in 1000), death (1 in 1000), kidney failure [usually temporary] (1 in 500), bleeding (1 in 200), allergic reaction [possibly serious] (1 in 200), and agrees to proceed.   2. CAD: As discussed above, DES to the right coronary artery 11 2018, with DES to the distal LAD in 09/2017. The recommendation of Dr. Herbie Baltimore, will increase her isosorbide from 30 mg daily to 60 mg daily in the event that she is having coronary artery spasm. She will continue dual antiplatelet therapy without ceasing.  3. Hypercholesterolemia: Continue statin therapy.  4. Hypertension: Blood pressure is currently well-controlled today. No other changes to her medication regimen at this time.     medicines are reviewed at length with the patient today.    Labs/ tests ordered today include: CBC, INR   Bettey Mare. Liborio Nixon, ANP, AACC   11/11/2017 4:09 PM    [facility] [address] [place], [place] [address] Phone:  [phone]; Fax: [phone]

## 2017-11-11 NOTE — Telephone Encounter (Signed)
Returned call to patient, patient reports episodes of CP, SOB, and diaphoresis x 3 days.  Reports at night when laying down she had episode of CP with SOB and was sweating profusely.    Reports pain radiates into R arm.   Has not taken NTG but is taking Imdur as prescribed.   Denies pain at current but is concerned with her recent issues.    Per chart review:  1.  Coronary artery disease: Patient had repeat cardiac catheterization 10/17/2017. Recurrent symptoms. He was found that her LAD lesion progress from 25% to 99%, and required PCI mid LAD with atherectomy did very well. She's feeling much better has got her energy back pain free and has no further complaints of chest pain.  Her only complaint is headache with isosorbide. I've advised her to take Tylenol around-the-clock for the next several weeks, and her body will get used to it soon. She will notify us if this is not the case. I have given her refills she was only given 30 day supply. She should continue aspirin and Plavix indefinitely. She remains on beta blocker therapy. She is not currently on ACE inhibitor, there is been no evidence of remodeling per echocardiogram, with normal LV systolic function.  She is released to begin cardiac rehabilitation, which is going to begin on October 29, 2017. She is eager to begin.   Patient scheduled to see K. Lawrence DNP today at 3 pm.

## 2017-11-11 NOTE — Telephone Encounter (Signed)
New Message   Pt c/o Shortness Of Breath: STAT if SOB developed within the last 24 hours or pt is noticeably SOB on the phone  1. Are you currently SOB (can you hear that pt is SOB on the phone)? Yes, I can not hear SOB  2. How long have you been experiencing SOB? 3 days  3. Are you SOB when sitting or when up moving around? When moving around   4. Are you currently experiencing any other symptoms? Chest pains  Pt c/o of Chest Pain: STAT if CP now or developed within 24 hours  1. Are you having CP right now? No   2. Are you experiencing any other symptoms (ex. SOB, nausea, vomiting, sweating)? SOB  3. How long have you been experiencing CP? 3 days  4. Is your CP continuous or coming and going? Coming and going   5. Have you taken Nitroglycerin? Yes, takes extended relief during the day.   Patient states the chest pain is only at night when lying down so she sleeps propped up.  ?

## 2017-11-11 NOTE — H&P (View-Only) (Signed)
Cardiology Office Note   Date:  11/11/2017   ID:  Mia Gonzales, DOB Nov 24, 1950, MRN [id number]  PCP:  Caffie Damme, MD  Cardiologist:  Dr. Herbie Baltimore  Chief Complaint  Patient presents with  . Chest Pain     History of Present Illness: Mia Gonzales is a 67 y.o. female who presents forongoing assessment and management of coronary artery disease, status post drug-eluting stent to the mid RCA in November 2018 with nonobstructive disease elsewhere within the coronary tree, hyperlipidemia, history of prior tobacco abuse, history of right renal carcinoma status post radical nephrectomy, and diabetes. Due to recurrent symptoms of DOE and chest pressure, she was scheduled for OP cardiac catheterization.  CORONARY STENT INTERVENTION 10/17/2017-[facility]   LEFT HEART CATH AND CORONARY ANGIOGRAPHY  Conclusion     Mid-distal LAD lesion is 80% stenosed.  A drug-eluting stent was successfully placed using a STENT SIERRA 2.25 X 12 MM (2.4 MM post-dilation)  Post intervention, there is a 0% residual stenosis.  Dist LAD lesion is 99% stenosed.  Balloon angioplasty was performed using a BALLOON SAPPHIRE 1.5X10. Post intervention, there is a 45% residual stenosis with brisk flow.  Previously placed Mid RCA drug eluting stent is widely patent.  Mid Cx lesion is 25% stenosed. 1st Mrg lesion is 25% stenosed.  The left ventricular systolic function is normal. The left ventricular ejection fraction is 55-65% by visual estimate.  LV end diastolic pressure is normal.  There is no mitral valve regurgitation.  Post intervention, there is a 0% residual stenosis.  A drug-eluting stent was successfully placed using a STENT SIERRA 2.25 X 12 MM.  Patent RCA stent with progression of mLAD 20% to 80% & apical LAD 99% -> DES PCI of dLAD & PTCA of apical lesion.   She comes today with recurrent symptoms. She had been going to cardiac rehabilitation; on Friday, Feb 15th, 2019 she began to have discomfort in her  chest radiating down her right arm and into her right jaw while exercising. He was unable to complete her exercise. She became very fatigued Friday afternoon and slept most of the day Saturday. She states that she's he recurrent complaints of discomfort jaw pain and right arm pain exactly like she was feeling her to her stent placement in January 2019. She chose to ignore this, until her sister insisted that she come to the office today to be evaluated.  She states just walking back to the exam room today she was exhausted. She is complaining of some right jaw pain and some pain in the pocket of her neck currently. She has been medically compliant and has been taking isosorbide as directed as well. The pain does not occur with deep breathing.   Past Medical History:  Diagnosis Date  . Asthma 01/06/2017  . Coronary artery disease   . Essential hypertension 01/06/2017  . Fibromyalgia   . Pleuritic chest pain 01/06/2017  . Shortness of breath 01/06/2017    Past Surgical History:  Procedure Laterality Date  . CARDIAC CATHETERIZATION  08/18/2017   S/P DES RCA  Dr Eldridge Dace  . CHOLECYSTECTOMY    . CORONARY STENT INTERVENTION N/A 08/18/2017   Procedure: CORONARY STENT INTERVENTION;  Surgeon: Corky Crafts, MD;  Location: [facility] INVASIVE CV LAB;  Service: Cardiovascular;  Laterality: N/A;  . CORONARY STENT INTERVENTION N/A 10/17/2017   Procedure: CORONARY STENT INTERVENTION;  Surgeon: Marykay Lex, MD;  Location: [facility] INVASIVE CV LAB;  Service: Cardiovascular;  Laterality: N/A;  . LEFT HEART CATH AND  CORONARY ANGIOGRAPHY N/A 08/18/2017   Procedure: LEFT HEART CATH AND CORONARY ANGIOGRAPHY;  Surgeon: Corky Crafts, MD;  Location: [facility] INVASIVE CV LAB;  Service: Cardiovascular;  Laterality: N/A;  . LEFT HEART CATH AND CORONARY ANGIOGRAPHY N/A 10/17/2017   Procedure: LEFT HEART CATH AND CORONARY ANGIOGRAPHY;  Surgeon: Marykay Lex, MD;  Location: [facility] INVASIVE CV LAB;  Service: Cardiovascular;   Laterality: N/A;  . NEPHRECTOMY       Current Outpatient Medications  Medication Sig Dispense Refill  . aspirin EC 81 MG tablet Take 81 mg by mouth daily.    Marland Kitchen atorvastatin (LIPITOR) 80 MG tablet Take 1 tablet (80 mg total) by mouth daily at 6 PM. 90 tablet 1  . Cholecalciferol (VITAMIN D3) 10000 units TABS Take 5,000 Units by mouth daily.    . clopidogrel (PLAVIX) 75 MG tablet Take 1 tablet (75 mg total) by mouth daily. 90 tablet 1  . Coenzyme Q10 (COQ10) 100 MG CAPS Take 1 capsule by mouth daily. 30 each   . gabapentin (NEURONTIN) 300 MG capsule Take 300 mg by mouth 2 (two) times daily.     . isosorbide mononitrate (IMDUR) 60 MG 24 hr tablet Take 1 tablet (60 mg total) by mouth daily. 30 tablet 3  . metoprolol succinate (TOPROL-XL) 25 MG 24 hr tablet Take 1 tablet (25 mg total) by mouth daily. 90 tablet 1  . nitroGLYCERIN (NITROSTAT) 0.4 MG SL tablet Place 1 tablet (0.4 mg total) under the tongue every 5 (five) minutes as needed for chest pain. 25 tablet 12  . venlafaxine XR (EFFEXOR-XR) 75 MG 24 hr capsule Take 75 mg by mouth 2 (two) times daily.      No current facility-administered medications for this visit.     Allergies:   Povidone-iodine; Tramadol-acetaminophen; Codeine; and Fentanyl    Social History:  The patient  reports that she has quit smoking. she has never used smokeless tobacco. She reports that she does not drink alcohol or use drugs.   Family History:  The patient's family history includes CAD in her sister; Cancer in her father and mother; Cardiomyopathy in her sister; Diabetes in her father and paternal grandmother; Heart attack in her maternal grandfather and sister; Heart disease in her maternal grandfather and maternal grandmother; Rheum arthritis in her mother.    ROS: All other systems are reviewed and negative. Unless otherwise mentioned in H&P    PHYSICAL EXAM: VS:  BP 122/68   Pulse 80   Ht 5\' 7"  (1.702 m)   Wt 212 lb 9.6 oz (96.4 kg)   BMI 33.30  kg/m  , BMI Body mass index is 33.3 kg/m. GEN: Well nourished, well developed, in no acute distress  HEENT: normal  Neck: no JVD, carotid bruits, or masses Cardiac: RRR; no murmurs, rubs, or gallops,no edema  Respiratory:  clear to auscultation bilaterally, normal work of breathing GI: soft, nontender, nondistended, + BS MS: no deformity or atrophy  Skin: warm and dry, no rash Neuro:  Strength and sensation are intact Psych: euthymic mood, full affect   EKG: Normal sinus rhythm with nonspecific ST-T wave abnormality in the lateral leads, and also in the shoe in V3. Heart rate of 80 bpm.  Recent Labs: 10/15/2017: Hemoglobin 13.7; Platelets 261 11/06/2017: ALT 9; BUN 21; Creatinine, Ser 1.24; Potassium 4.6; Sodium 142    Lipid Panel    Component Value Date/Time   CHOL 112 11/06/2017 1448   TRIG 252 (H) 11/06/2017 1448   HDL 32 (L)  11/06/2017 1448   CHOLHDL 3.5 11/06/2017 1448   CHOLHDL 6.6 08/17/2017 0015   VLDL 53 (H) 08/17/2017 0015   LDLCALC 30 11/06/2017 1448      Wt Readings from Last 3 Encounters:  11/11/17 212 lb 9.6 oz (96.4 kg)  10/27/17 213 lb (96.6 kg)  10/17/17 211 lb (95.7 kg)      Other studies Reviewed: Cardiac Cath 10/18/2016-[facility] Conclusion     Mid RCA lesion is 99% stenosed. A drug-eluting stent was successfully placed using a STENT SIERRA 3.00 X 38 MM, postdilated to > 3.5 mm.  Post intervention, there is a 0% residual stenosis.  1st Mrg lesion is 25% stenosed.  Mid Cx lesion is 25% stenosed.  Mid LAD lesion is 20% stenosed.  The left ventricular ejection fraction is 55-65% by visual estimate.  LV end diastolic pressure is mildly elevated. LVEDP 21 mm Hg.  The left ventricular systolic function is normal.  There is no aortic valve stenosis.   Nonobstructive disease in the left system.  Continue dual antiplatelet therapy for one year with aggressive secondary prevention.       ASSESSMENT AND PLAN:  1.  Recurrent chest pain:  Due to known history of CAD with recent stent placement to the distal LAD, on 10/17/2017, with prior stent to the right coronary artery in November 2018, with progressive disease between November and January 2019, I'm concerned she may have progressive disease, and failure, versus coronary artery spasms.  I have discussed this with Dr. Herbie Baltimore who is Onsite today. He is reviewed both cardiac catheterization reports. It is his recommendation she have a repeat cardiac catheterization for further evaluation of coronary anatomy for evaluation of progressive disease or stent failure.  I have discussed this with the patient is surprised that she may need to be restudied, but is willing to proceed with this. The patient is scheduled for Wednesday, 11/12/2017 with Dr. Herbie Baltimore. She does not need new labs she has had recent labs on 11/06/2017. A repeat INR and CBC will be ordered as they had not been completed recently.  The patient understands that risks include but are not limited to stroke (1 in 1000), death (1 in 1000), kidney failure [usually temporary] (1 in 500), bleeding (1 in 200), allergic reaction [possibly serious] (1 in 200), and agrees to proceed.   2. CAD: As discussed above, DES to the right coronary artery 11 2018, with DES to the distal LAD in 09/2017. The recommendation of Dr. Herbie Baltimore, will increase her isosorbide from 30 mg daily to 60 mg daily in the event that she is having coronary artery spasm. She will continue dual antiplatelet therapy without ceasing.  3. Hypercholesterolemia: Continue statin therapy.  4. Hypertension: Blood pressure is currently well-controlled today. No other changes to her medication regimen at this time.     medicines are reviewed at length with the patient today.    Labs/ tests ordered today include: CBC, INR   Bettey Mare. Liborio Nixon, ANP, AACC   11/11/2017 4:09 PM    [facility] [address] [place], [place] [address] Phone:  [phone]; Fax: [phone]

## 2017-11-11 NOTE — Patient Instructions (Addendum)
Medication Instructions:  INCREASE ISOSORBIDE 60MG  DAILY  If you need a refill on your cardiac medications before your next appointment, please call your pharmacy.  Testing/Procedures: Your physician has requested that you have a cardiac catheterization. Cardiac catheterization is used to diagnose and/or treat various heart conditions. Doctors may recommend this procedure for a number of different reasons. The most common reason is to evaluate chest pain. Chest pain can be a symptom of coronary artery disease (CAD), and cardiac catheterization can show whether plaque is narrowing or blocking your heart's arteries. This procedure is also used to evaluate the valves, as well as measure the blood flow and oxygen levels in different parts of your heart. For further information please visit [email or web address] Please follow instruction sheet, as given.  Follow-Up: Your physician wants you to follow-up in: AFTER CATH.    Thank you for choosing [facility] at [facility]!!      [facility] CARDIOVASCULAR DIVISION [facility] [address] [place] [address] Dept: [phone] Loc: [phone]  Mattisyn Denherder  11/11/2017  You are scheduled for a Cardiac Catheterization on Wednesday, February  20 with Dr. Bryan Lemma.  1. Please arrive at the [facility] (Main Entrance A) at [facility]: [address] [place], [place] [address] at 2:00 PM (two hours before your procedure to ensure your preparation). Free valet parking service is available.   Special note: Every effort is made to have your procedure done on time. Please understand that emergencies sometimes delay scheduled procedures.  2. Diet: You may have a full breakfast, but nothing after 8 AM on your procedure day.                                        Light 3. Labs: You will need to have blood drawn on Tuesday, February 19 at [facility] [address],  [place]  Open: 8am - 5pm (Lunch 12:30 - 1:30)   Phone: [phone]. You do not need to be fasting.  4. Medication instructions in preparation for your procedure:  On the morning of your procedure, take your Aspirin and all morning medicines NOT listed above.  You may use sips of water.  5. Plan for one night stay--bring personal belongings. 6. Bring a current list of your medications and current insurance cards. 7. You MUST have a responsible person to drive you home. 8. Someone MUST be with you the first 24 hours after you arrive home or your discharge will be delayed. 9. Please wear clothes that are easy to get on and off and wear slip-on shoes.  Thank you for allowing [place] to care for you!   -- [facility]

## 2017-11-12 ENCOUNTER — Encounter (HOSPITAL_COMMUNITY): Payer: Medicare HMO

## 2017-11-12 ENCOUNTER — Encounter (HOSPITAL_COMMUNITY)
Admission: RE | Disposition: A | Discharge status: Home / Self Care | Payer: Self-pay | Source: Ambulatory Visit | Attending: Cardiology

## 2017-11-12 ENCOUNTER — Telehealth (HOSPITAL_COMMUNITY): Payer: Self-pay | Admitting: Family Medicine

## 2017-11-12 ENCOUNTER — Observation Stay (HOSPITAL_COMMUNITY)
Admission: RE | Admit: 2017-11-12 | Discharge: 2017-11-13 | Disposition: A | Discharge status: Home / Self Care | Payer: Medicare HMO | Source: Ambulatory Visit | Attending: Cardiology | Admitting: Cardiology

## 2017-11-12 DIAGNOSIS — Z85528 Personal history of other malignant neoplasm of kidney: Secondary | ICD-10-CM | POA: Diagnosis not present

## 2017-11-12 DIAGNOSIS — Z7982 Long term (current) use of aspirin: Secondary | ICD-10-CM | POA: Insufficient documentation

## 2017-11-12 DIAGNOSIS — E7800 Pure hypercholesterolemia, unspecified: Secondary | ICD-10-CM | POA: Diagnosis not present

## 2017-11-12 DIAGNOSIS — I2582 Chronic total occlusion of coronary artery: Secondary | ICD-10-CM | POA: Insufficient documentation

## 2017-11-12 DIAGNOSIS — M797 Fibromyalgia: Secondary | ICD-10-CM | POA: Insufficient documentation

## 2017-11-12 DIAGNOSIS — J45909 Unspecified asthma, uncomplicated: Secondary | ICD-10-CM | POA: Diagnosis not present

## 2017-11-12 DIAGNOSIS — E119 Type 2 diabetes mellitus without complications: Secondary | ICD-10-CM | POA: Diagnosis not present

## 2017-11-12 DIAGNOSIS — Z7902 Long term (current) use of antithrombotics/antiplatelets: Secondary | ICD-10-CM | POA: Diagnosis not present

## 2017-11-12 DIAGNOSIS — Z905 Acquired absence of kidney: Secondary | ICD-10-CM | POA: Insufficient documentation

## 2017-11-12 DIAGNOSIS — I10 Essential (primary) hypertension: Secondary | ICD-10-CM | POA: Insufficient documentation

## 2017-11-12 DIAGNOSIS — Z955 Presence of coronary angioplasty implant and graft: Secondary | ICD-10-CM | POA: Diagnosis not present

## 2017-11-12 DIAGNOSIS — I25110 Atherosclerotic heart disease of native coronary artery with unstable angina pectoris: Principal | ICD-10-CM

## 2017-11-12 DIAGNOSIS — Z885 Allergy status to narcotic agent status: Secondary | ICD-10-CM | POA: Insufficient documentation

## 2017-11-12 DIAGNOSIS — I200 Unstable angina: Secondary | ICD-10-CM | POA: Diagnosis present

## 2017-11-12 DIAGNOSIS — Z87891 Personal history of nicotine dependence: Secondary | ICD-10-CM | POA: Insufficient documentation

## 2017-11-12 DIAGNOSIS — Z8249 Family history of ischemic heart disease and other diseases of the circulatory system: Secondary | ICD-10-CM | POA: Diagnosis not present

## 2017-11-12 HISTORY — PX: LEFT HEART CATH AND CORONARY ANGIOGRAPHY: CATH118249

## 2017-11-12 HISTORY — DX: Angina pectoris, unspecified: I20.9

## 2017-11-12 HISTORY — DX: Prediabetes: R73.03

## 2017-11-12 LAB — CBC
HEMATOCRIT: 39.9 % (ref 34.0–46.6)
Hemoglobin: 13.4 g/dL (ref 11.1–15.9)
MCH: 27.6 pg (ref 26.6–33.0)
MCHC: 33.6 g/dL (ref 31.5–35.7)
MCV: 82 fL (ref 79–97)
Platelets: 217 10*3/uL (ref 150–379)
RBC: 4.86 x10E6/uL (ref 3.77–5.28)
RDW: 13.7 % (ref 12.3–15.4)
WBC: 10.3 10*3/uL (ref 3.4–10.8)

## 2017-11-12 LAB — PROTIME-INR
INR: 1 (ref 0.8–1.2)
Prothrombin Time: 10.6 s (ref 9.1–12.0)

## 2017-11-12 SURGERY — LEFT HEART CATH AND CORONARY ANGIOGRAPHY
Anesthesia: LOCAL

## 2017-11-12 MED ORDER — VENLAFAXINE HCL ER 75 MG PO CP24
75.0000 mg | ORAL_CAPSULE | Freq: Two times a day (BID) | ORAL | Status: DC
  Administered 2017-11-12 – 2017-11-13 (×2): 75 mg via ORAL
  Filled 2017-11-12 (×2): qty 1

## 2017-11-12 MED ORDER — ATORVASTATIN CALCIUM 80 MG PO TABS
80.0000 mg | ORAL_TABLET | Freq: Every day | ORAL | Status: DC
  Administered 2017-11-12: 80 mg via ORAL
  Filled 2017-11-12: qty 1

## 2017-11-12 MED ORDER — MORPHINE SULFATE (PF) 10 MG/ML IV SOLN
INTRAVENOUS | Status: AC
Start: 2017-11-12 — End: ?
  Filled 2017-11-12: qty 1

## 2017-11-12 MED ORDER — SODIUM CHLORIDE 0.9 % IV SOLN: 250.0000 mL | INTRAVENOUS | Status: DC | PRN

## 2017-11-12 MED ORDER — HEPARIN SODIUM (PORCINE) 1000 UNIT/ML IJ SOLN
INTRAMUSCULAR | Status: AC
  Filled 2017-11-12: qty 1

## 2017-11-12 MED ORDER — SODIUM CHLORIDE 0.9% FLUSH: 3.0000 mL | INTRAVENOUS | Status: DC | PRN

## 2017-11-12 MED ORDER — METOPROLOL SUCCINATE ER 50 MG PO TB24
50.0000 mg | ORAL_TABLET | Freq: Every day | ORAL | Status: DC
  Administered 2017-11-13: 50 mg via ORAL
  Filled 2017-11-12: qty 1

## 2017-11-12 MED ORDER — MIDAZOLAM HCL 2 MG/2ML IJ SOLN
INTRAMUSCULAR | Status: DC | PRN
  Administered 2017-11-12 (×2): 1 mg via INTRAVENOUS

## 2017-11-12 MED ORDER — IOPAMIDOL (ISOVUE-370) INJECTION 76%
INTRAVENOUS | Status: DC | PRN
  Administered 2017-11-12: 38 mL via INTRA_ARTERIAL

## 2017-11-12 MED ORDER — ASPIRIN EC 81 MG PO TBEC
81.0000 mg | DELAYED_RELEASE_TABLET | Freq: Every day | ORAL | Status: DC
  Administered 2017-11-13: 81 mg via ORAL
  Filled 2017-11-12: qty 1

## 2017-11-12 MED ORDER — SODIUM CHLORIDE 0.9% FLUSH: 3.0000 mL | Freq: Two times a day (BID) | INTRAVENOUS | Status: DC

## 2017-11-12 MED ORDER — SODIUM CHLORIDE 0.9 % IV SOLN
INTRAVENOUS | Status: DC
  Administered 2017-11-12: 15:00:00 via INTRAVENOUS

## 2017-11-12 MED ORDER — MIDAZOLAM HCL 2 MG/2ML IJ SOLN
INTRAMUSCULAR | Status: AC
  Filled 2017-11-12: qty 2

## 2017-11-12 MED ORDER — ONDANSETRON HCL 4 MG/2ML IJ SOLN: 4.0000 mg | Freq: Four times a day (QID) | INTRAMUSCULAR | Status: DC | PRN

## 2017-11-12 MED ORDER — VERAPAMIL HCL 2.5 MG/ML IV SOLN
INTRAVENOUS | Status: AC
  Filled 2017-11-12: qty 2

## 2017-11-12 MED ORDER — MORPHINE SULFATE (PF) 10 MG/ML IV SOLN
INTRAVENOUS | Status: DC | PRN
  Administered 2017-11-12: 2 mg via INTRAVENOUS

## 2017-11-12 MED ORDER — NITROGLYCERIN 1 MG/10 ML FOR IR/CATH LAB
INTRA_ARTERIAL | Status: AC
  Filled 2017-11-12: qty 10

## 2017-11-12 MED ORDER — LIDOCAINE HCL (PF) 1 % IJ SOLN
INTRAMUSCULAR | Status: AC
  Filled 2017-11-12: qty 30

## 2017-11-12 MED ORDER — HEPARIN (PORCINE) IN NACL 2-0.9 UNIT/ML-% IJ SOLN
INTRAMUSCULAR | Status: AC
  Filled 2017-11-12: qty 500

## 2017-11-12 MED ORDER — NITROGLYCERIN 0.4 MG SL SUBL: 0.4000 mg | SUBLINGUAL_TABLET | SUBLINGUAL | Status: DC | PRN

## 2017-11-12 MED ORDER — ACETAMINOPHEN 500 MG PO TABS: 500.0000 mg | ORAL_TABLET | Freq: Four times a day (QID) | ORAL | Status: DC | PRN

## 2017-11-12 MED ORDER — MORPHINE SULFATE (PF) 2 MG/ML IV SOLN: 2.0000 mg | INTRAVENOUS | Status: DC | PRN

## 2017-11-12 MED ORDER — AMLODIPINE BESYLATE 2.5 MG PO TABS
2.5000 mg | ORAL_TABLET | Freq: Every day | ORAL | Status: DC
  Administered 2017-11-13: 2.5 mg via ORAL
  Filled 2017-11-12: qty 1

## 2017-11-12 MED ORDER — ISOSORBIDE MONONITRATE ER 60 MG PO TB24
60.0000 mg | ORAL_TABLET | Freq: Every day | ORAL | Status: DC
  Administered 2017-11-13: 60 mg via ORAL
  Filled 2017-11-12: qty 1

## 2017-11-12 MED ORDER — HEPARIN (PORCINE) IN NACL 2-0.9 UNIT/ML-% IJ SOLN
INTRAMUSCULAR | Status: DC | PRN
  Administered 2017-11-12: 10 mL via INTRA_ARTERIAL

## 2017-11-12 MED ORDER — ASPIRIN 81 MG PO CHEW: 81.0000 mg | CHEWABLE_TABLET | ORAL | Status: DC

## 2017-11-12 MED ORDER — HEPARIN (PORCINE) IN NACL 2-0.9 UNIT/ML-% IJ SOLN
INTRAMUSCULAR | Status: AC | PRN
  Administered 2017-11-12: 500 mL

## 2017-11-12 MED ORDER — HEPARIN SODIUM (PORCINE) 1000 UNIT/ML IJ SOLN
INTRAMUSCULAR | Status: DC | PRN
  Administered 2017-11-12: 5000 [IU] via INTRAVENOUS

## 2017-11-12 MED ORDER — LIDOCAINE HCL (PF) 1 % IJ SOLN
INTRAMUSCULAR | Status: DC | PRN
  Administered 2017-11-12: 2 mL

## 2017-11-12 MED ORDER — ACETAMINOPHEN 325 MG PO TABS
650.0000 mg | ORAL_TABLET | ORAL | Status: DC | PRN
  Administered 2017-11-12: 650 mg via ORAL
  Filled 2017-11-12: qty 2

## 2017-11-12 MED ORDER — CLOPIDOGREL BISULFATE 75 MG PO TABS
75.0000 mg | ORAL_TABLET | Freq: Every day | ORAL | Status: DC
  Administered 2017-11-13: 75 mg via ORAL
  Filled 2017-11-12: qty 1

## 2017-11-12 MED ORDER — GABAPENTIN 300 MG PO CAPS
600.0000 mg | ORAL_CAPSULE | Freq: Every day | ORAL | Status: DC
  Administered 2017-11-13: 600 mg via ORAL
  Filled 2017-11-12: qty 2

## 2017-11-12 MED ORDER — SODIUM CHLORIDE 0.9 % IV SOLN: INTRAVENOUS | Status: AC

## 2017-11-12 MED ORDER — RANOLAZINE ER 500 MG PO TB12
500.0000 mg | ORAL_TABLET | Freq: Two times a day (BID) | ORAL | Status: DC
  Administered 2017-11-12 – 2017-11-13 (×2): 500 mg via ORAL
  Filled 2017-11-12 (×2): qty 1

## 2017-11-12 MED ORDER — NITROGLYCERIN IN D5W 200-5 MCG/ML-% IV SOLN
0.0000 ug/min | INTRAVENOUS | Status: DC
  Administered 2017-11-12: 10 ug/min via INTRAVENOUS

## 2017-11-12 MED ORDER — NITROGLYCERIN 1 MG/10 ML FOR IR/CATH LAB
INTRA_ARTERIAL | Status: DC | PRN
  Administered 2017-11-12: 200 ug

## 2017-11-12 MED ORDER — NITROGLYCERIN IN D5W 200-5 MCG/ML-% IV SOLN
INTRAVENOUS | Status: AC
  Filled 2017-11-12: qty 250

## 2017-11-12 MED ORDER — GABAPENTIN 300 MG PO CAPS
900.0000 mg | ORAL_CAPSULE | Freq: Every day | ORAL | Status: DC
  Administered 2017-11-12: 900 mg via ORAL
  Filled 2017-11-12: qty 3

## 2017-11-12 SURGICAL SUPPLY — 14 items
CATH OPTITORQUE TIG 4.0 5F (CATHETERS) ×2 IMPLANT
DEVICE RAD COMP TR BAND LRG (VASCULAR PRODUCTS) ×2 IMPLANT
GLIDESHEATH SLEND A-KIT 6F 22G (SHEATH) ×2 IMPLANT
GUIDEWIRE INQWIRE 1.5J.035X260 (WIRE) ×1 IMPLANT
INQWIRE 1.5J .035X260CM (WIRE) ×2
KIT HEART LEFT (KITS) ×2 IMPLANT
KIT PREMIUM HAND CONTROLLER (KITS) ×2 IMPLANT
KIT SINGLE USE MANIFOLD (KITS) ×2 IMPLANT
PACK CARDIAC CATHETERIZATION (CUSTOM PROCEDURE TRAY) ×2 IMPLANT
PROTECTION STATION PRESSURIZED (MISCELLANEOUS) ×2
STATION PROTECTION PRESSURIZED (MISCELLANEOUS) ×1 IMPLANT
SYR MEDRAD MARK V 150ML (SYRINGE) ×2 IMPLANT
TRANSDUCER W/STOPCOCK (MISCELLANEOUS) ×2 IMPLANT
TUBING CIL FLEX 10 FLL-RA (TUBING) ×2 IMPLANT

## 2017-11-12 NOTE — Plan of Care (Signed)
  Progressing Education: Understanding of CV disease, CV risk reduction, and recovery process will improve 11/12/2017 2057 - Progressing by Renelda Mom, RN Activity: Ability to return to baseline activity level will improve 11/12/2017 2057 - Progressing by Renelda Mom, RN Cardiovascular: Ability to achieve and maintain adequate cardiovascular perfusion will improve 11/12/2017 2057 - Progressing by Renelda Mom, RN

## 2017-11-12 NOTE — Progress Notes (Signed)
Pt received from cath lab. VSS. TR band in place, right hand with some petechiae, good cap refill. CHG done, telemetry applied. Oriented to room. Call light in reach, will continue to monitor.  Versie Starks, RN

## 2017-11-12 NOTE — Progress Notes (Signed)
Pt difficult IV insertion. IV team able to get 22G in right anterior forearm. Thereasa Distance in cath lab called and informed of only one IV and placement. No new orders received.

## 2017-11-12 NOTE — Interval H&P Note (Signed)
History and Physical Interval Note:  11/12/2017 4:29 PM  Zollie Fonseca  has presented today for surgery, with the diagnosis of cp - cad - ua.  The various methods of treatment have been discussed with the patient and family. After consideration of risks, benefits and other options for treatment, the patient has consented to  Procedure(s): LEFT HEART CATH AND CORONARY ANGIOGRAPHY (N/A) WITH POSSIBLE PERCUTANEOUS CORONARY INTERVENTION as a surgical intervention .  The patient's history has been reviewed, patient examined, no change in status, stable for surgery.  I have reviewed the patient's chart and labs.  Questions were answered to the patient's satisfaction.    Cath Lab Visit (complete for each Cath Lab visit)  Clinical Evaluation Leading to the Procedure:   ACS: Yes.    Non-ACS:    Anginal Classification: CCS IV  Anti-ischemic medical therapy: Maximal Therapy (2 or more classes of medications)   Non-Invasive Test Results: No non-invasive testing performed  Prior CABG: No previous CABG   Bryan Lemma

## 2017-11-13 ENCOUNTER — Encounter (HOSPITAL_COMMUNITY): Payer: Self-pay | Admitting: Cardiology

## 2017-11-13 ENCOUNTER — Encounter (HOSPITAL_COMMUNITY): Payer: Self-pay | Admitting: *Deleted

## 2017-11-13 ENCOUNTER — Other Ambulatory Visit: Payer: Self-pay

## 2017-11-13 DIAGNOSIS — I25110 Atherosclerotic heart disease of native coronary artery with unstable angina pectoris: Secondary | ICD-10-CM | POA: Diagnosis not present

## 2017-11-13 DIAGNOSIS — I200 Unstable angina: Secondary | ICD-10-CM

## 2017-11-13 DIAGNOSIS — Z955 Presence of coronary angioplasty implant and graft: Secondary | ICD-10-CM

## 2017-11-13 DIAGNOSIS — I214 Non-ST elevation (NSTEMI) myocardial infarction: Secondary | ICD-10-CM

## 2017-11-13 MED ORDER — RANOLAZINE ER 500 MG PO TB12: 500.0000 mg | ORAL_TABLET | Freq: Two times a day (BID) | ORAL | 5 refills | Status: AC

## 2017-11-13 MED ORDER — AMLODIPINE BESYLATE 2.5 MG PO TABS: 2.5000 mg | ORAL_TABLET | Freq: Every day | ORAL | 5 refills | Status: DC

## 2017-11-13 MED ORDER — METOPROLOL SUCCINATE ER 50 MG PO TB24: 50.0000 mg | ORAL_TABLET | Freq: Every day | ORAL | 3 refills | Status: AC

## 2017-11-13 NOTE — Progress Notes (Signed)
Progress Note  Patient Name: Mia Gonzales Date of Encounter: 11/13/2017  Primary Cardiologist:   Chilton Si, MD   Subjective   She had a sharp pain.  No pain similar to her previous angina.    Inpatient Medications    Scheduled Meds: . amLODipine  2.5 mg Oral Daily  . aspirin EC  81 mg Oral Daily  . atorvastatin  80 mg Oral q1800  . clopidogrel  75 mg Oral Daily  . gabapentin  600 mg Oral Daily  . gabapentin  900 mg Oral QHS  . isosorbide mononitrate  60 mg Oral Daily  . metoprolol succinate  50 mg Oral Daily  . ranolazine  500 mg Oral BID  . sodium chloride flush  3 mL Intravenous Q12H  . venlafaxine XR  75 mg Oral BID   Continuous Infusions: . sodium chloride    . nitroGLYCERIN 10 mcg/min (11/13/17 0600)   PRN Meds: sodium chloride, acetaminophen, morphine injection, nitroGLYCERIN, ondansetron (ZOFRAN) IV, sodium chloride flush   Vital Signs    Vitals:   11/12/17 2014 11/13/17 0420 11/13/17 0849 11/13/17 0855  BP: (!) 108/53 (!) 128/59 (!) 121/54 (!) 121/54  Pulse:   68 63  Resp:    17  Temp: 98.8 F (37.1 C) 97.7 F (36.5 C)  (!) 97.5 F (36.4 C)  TempSrc: Oral Oral  Oral  SpO2: 97% 96%  95%  Weight:      Height:        Intake/Output Summary (Last 24 hours) at 11/13/2017 1038 Last data filed at 11/13/2017 0800 Gross per 24 hour  Intake 1470 ml  Output -  Net 1470 ml   Filed Weights   11/12/17 1419  Weight: 212 lb (96.2 kg)    Telemetry    NSR - Personally Reviewed  ECG    NA - Personally Reviewed  Physical Exam   GEN: No acute distress.   Neck: No  JVD Cardiac: RRR, no murmurs, rubs, or gallops.  Respiratory: Clear  to auscultation bilaterally. GI: Soft, nontender, non-distended  MS: Mild bruising right wrist.  No edema; No deformity. Neuro:  Nonfocal  Psych: Normal affect   Labs    Chemistry Recent Labs  Lab 11/06/17 1448  NA 142  K 4.6  CL 101  CO2 24  GLUCOSE 149*  BUN 21  CREATININE 1.24*  CALCIUM 9.5  PROT  7.0  ALBUMIN 4.4  AST 14  ALT 9  ALKPHOS 116  BILITOT 0.3  GFRNONAA 45*  GFRAA 52*     Hematology Recent Labs  Lab 11/11/17 1610  WBC 10.3  RBC 4.86  HGB 13.4  HCT 39.9  MCV 82  MCH 27.6  MCHC 33.6  RDW 13.7  PLT 217    Cardiac EnzymesNo results for input(s): TROPONINI in the last 168 hours. No results for input(s): TROPIPOC in the last 168 hours.   BNPNo results for input(s): BNP, PROBNP in the last 168 hours.   DDimer No results for input(s): DDIMER in the last 168 hours.   Radiology    No results found.  Cardiac Studies   CARDIAC CATH:     Culprit lesion: Dist LAD lesion is 100% stenosed at the site of previous angioplasty. Plan for now is medical management.  Previously placed Mid LAD drug eluting stent is widely patent.  Mid RCA stent, widely patent .  Ost 1st Mrg lesion is 55% stenosed.  LV end diastolic pressure is normal.     Patient Profile  67 y.o. female who presented forongoing assessment and management of coronary artery disease, status post drug-eluting stent to the mid RCA in November 2018 with nonobstructive disease elsewhere within the coronary tree, hyperlipidemia, history of prior tobacco abuse, history of right renal carcinoma status post radical nephrectomy, and diabetes.Due to recurrent symptoms of DOE and chest pressure, she was scheduled for OP cardiac catheterization.  She had disease as above.    Assessment & Plan    UNSTABLE ANGINA:   Treated with increased dose of Imdur and Ranexa added.   No anginal chest pain.  Discontinue IV NTG.  Home after she ambulates.    DYSLIPIDEMIA:  Continue previous meds.   HTN:  Meds as listed on MAR.  For questions or updates, please contact [facility] Please consult [email or web address] for contact info under Cardiology/STEMI.   Signed, Rollene Rotunda, MD  11/13/2017, 10:38 AM

## 2017-11-13 NOTE — Progress Notes (Signed)
Cardiac Individual Treatment Plan  Patient Details  Name: Mia Gonzales MRN: [id number] Date of Birth: [birth year] Referring Provider:     CARDIAC REHAB PHASE II ORIENTATION from 10/02/2017 in [facility] CARDIAC REHAB  Referring Provider  Chilton Si MD      Initial Encounter Date:    CARDIAC REHAB PHASE II ORIENTATION from 10/02/2017 in [facility] CARDIAC REHAB  Date  10/02/17  Referring Provider  Chilton Si MD      Visit Diagnosis: NSTEMI (non-ST elevated myocardial infarction) (HCC) 08/16/17  S/P coronary artery stent placement  DES RCA 08/18/17, 10/17/17 DES MID LAD  Patient's Home Medications on Admission: No current facility-administered medications for this visit.   Current Outpatient Medications:  .  [START ON 11/14/2017] amLODipine (NORVASC) 2.5 MG tablet, Take 1 tablet (2.5 mg total) by mouth daily., Disp: 30 tablet, Rfl: 5 .  [START ON 11/14/2017] metoprolol succinate (TOPROL-XL) 50 MG 24 hr tablet, Take 1 tablet (50 mg total) by mouth daily. Take with or immediately following a meal., Disp: 90 tablet, Rfl: 3 .  ranolazine (RANEXA) 500 MG 12 hr tablet, Take 1 tablet (500 mg total) by mouth 2 (two) times daily., Disp: 60 tablet, Rfl: 5  Facility-Administered Medications Ordered in Other Visits:  .  0.9 %  sodium chloride infusion, 250 mL, Intravenous, PRN, Marykay Lex, MD .  acetaminophen (TYLENOL) tablet 650 mg, 650 mg, Oral, Q4H PRN, Marykay Lex, MD, 650 mg at 11/12/17 1955 .  amLODipine (NORVASC) tablet 2.5 mg, 2.5 mg, Oral, Daily, Marykay Lex, MD, 2.5 mg at 11/13/17 0847 .  aspirin EC tablet 81 mg, 81 mg, Oral, Daily, Marykay Lex, MD, 81 mg at 11/13/17 0847 .  atorvastatin (LIPITOR) tablet 80 mg, 80 mg, Oral, q1800, Marykay Lex, MD, 80 mg at 11/12/17 1955 .  clopidogrel (PLAVIX) tablet 75 mg, 75 mg, Oral, Daily, Marykay Lex, MD, 75 mg at 11/13/17 0848 .  gabapentin (NEURONTIN) capsule 600 mg, 600  mg, Oral, Daily, Marykay Lex, MD, 600 mg at 11/13/17 0848 .  gabapentin (NEURONTIN) capsule 900 mg, 900 mg, Oral, QHS, Marykay Lex, MD, 900 mg at 11/12/17 2155 .  isosorbide mononitrate (IMDUR) 24 hr tablet 60 mg, 60 mg, Oral, Daily, Marykay Lex, MD, 60 mg at 11/13/17 0849 .  metoprolol succinate (TOPROL-XL) 24 hr tablet 50 mg, 50 mg, Oral, Daily, Marykay Lex, MD, 50 mg at 11/13/17 0849 .  morphine 2 MG/ML injection 2 mg, 2 mg, Intravenous, Q1H PRN, Marykay Lex, MD .  nitroGLYCERIN (NITROSTAT) SL tablet 0.4 mg, 0.4 mg, Sublingual, Q5 min PRN, Marykay Lex, MD .  ondansetron [facility]) injection 4 mg, 4 mg, Intravenous, Q6H PRN, Marykay Lex, MD .  ranolazine [facility]) 12 hr tablet 500 mg, 500 mg, Oral, BID, Marykay Lex, MD, 500 mg at 11/13/17 0848 .  sodium chloride flush (NS) 0.9 % injection 3 mL, 3 mL, Intravenous, Q12H, Marykay Lex, MD .  sodium chloride flush (NS) 0.9 % injection 3 mL, 3 mL, Intravenous, PRN, Marykay Lex, MD .  venlafaxine XR [facility]) 24 hr capsule 75 mg, 75 mg, Oral, BID, Marykay Lex, MD, 75 mg at 11/13/17 0848  Past Medical History: Past Medical History:  Diagnosis Date  . Anginal pain (HCC)   . Asthma 01/06/2017  . Coronary artery disease   . Essential hypertension 01/06/2017  . Fibromyalgia   . Pleuritic chest pain 01/06/2017  .  Pre-diabetes   . Shortness of breath 01/06/2017    Tobacco Use: Social History   Tobacco Use  Smoking Status Former Smoker  Smokeless Tobacco Never Used    Labs: Recent Airline pilot for ITP Cardiac and Pulmonary Rehab Latest Ref Rng & Units 08/17/2017 11/06/2017   Cholestrol 100 - 199 mg/dL 409(W) 119   LDLCALC 0 - 99 mg/dL 147(W) 30   HDL >29 mg/dL 56(O) 13(Y)   Trlycerides 0 - 149 mg/dL 865(H) 846(N)   Hemoglobin A1c 4.8 - 5.6 % 7.6(H) -      Capillary Blood Glucose: Lab Results  Component Value Date   GLUCAP 105 (H) 08/19/2017   GLUCAP 143 (H)  08/19/2017   GLUCAP 170 (H) 08/18/2017   GLUCAP 103 (H) 08/18/2017   GLUCAP 115 (H) 08/18/2017     Exercise Target Goals:    Exercise Program Goal: Individual exercise prescription set using results from initial 6 min walk test and THRR while considering  patient's activity barriers and safety.   Exercise Prescription Goal: Initial exercise prescription builds to 30-45 minutes a day of aerobic activity, 2-3 days per week.  Home exercise guidelines will be given to patient during program as part of exercise prescription that the participant will acknowledge.  Activity Barriers & Risk Stratification: Activity Barriers & Cardiac Risk Stratification - 10/02/17 1516      Activity Barriers & Cardiac Risk Stratification   Activity Barriers  Arthritis;Deconditioning;Muscular Weakness;Fibromyalgia       6 Minute Walk: 6 Minute Walk    Row Name 10/02/17 1426 10/02/17 1458 10/02/17 1509     6 Minute Walk   Phase  Initial  -  -   Distance  1036 feet  -  1136 feet   Walk Time  6 minutes  -  -   # of Rest Breaks  0  -  -   MPH  1.96  -  2.2   METS  2.04  -  2.3   RPE  11  -  -   Perceived Dyspnea   1  -  -   VO2 Peak  7.14  -  7.88   Symptoms  Yes (comment)  -  -   Comments  mild SOB  -  -   Resting HR  66 bpm  -  -   Resting BP  108/80  -  -   Resting Oxygen Saturation   98 %  -  -   Exercise Oxygen Saturation  during 6 min walk  97 %  -  -   Max Ex. HR  92 bpm  96 bpm  -   Max Ex. BP  128/78  -  -   2 Minute Post BP  -  -  110/68      Oxygen Initial Assessment:   Oxygen Re-Evaluation:   Oxygen Discharge (Final Oxygen Re-Evaluation):   Initial Exercise Prescription: Initial Exercise Prescription - 10/02/17 1400      Date of Initial Exercise RX and Referring Provider   Date  10/02/17    Referring Provider  Chilton Si MD      NuStep   Level  2    SPM  70    Minutes  10    METs  1.5      Arm Ergometer   Level  2    Minutes  10    METs  1.5       Track  Laps  6    Minutes  10    METs  2.03      Prescription Details   Frequency (times per week)  3    Duration  Progress to 30 minutes of continuous aerobic without signs/symptoms of physical distress      Intensity   THRR 40-80% of Max Heartrate  61-122    Ratings of Perceived Exertion  11-15    Perceived Dyspnea  0-4      Progression   Progression  Continue to progress workloads to maintain intensity without signs/symptoms of physical distress.      Resistance Training   Training Prescription  Yes    Weight  2lbs    Reps  10-15       Perform Capillary Blood Glucose checks as needed.  Exercise Prescription Changes: Exercise Prescription Changes    Row Name 11/03/17 1456             Response to Exercise   Blood Pressure (Admit)  116/70  (Pended)        Blood Pressure (Exercise)  118/68  (Pended)        Blood Pressure (Exit)  104/60  (Pended)        Heart Rate (Admit)  84 bpm  (Pended)        Heart Rate (Exercise)  105 bpm  (Pended)        Heart Rate (Exit)  84 bpm  (Pended)        Rating of Perceived Exertion (Exercise)  13  (Pended)        Symptoms  none  (Pended)        Duration  Progress to 30 minutes of  aerobic without signs/symptoms of physical distress  (Pended)        Intensity  THRR unchanged  (Pended)          Progression   Progression  Continue to progress workloads to maintain intensity without signs/symptoms of physical distress.  (Pended)        Average METs  2.6  (Pended)          Resistance Training   Training Prescription  Yes  (Pended)        Weight  2lbs  (Pended)        Reps  10-15  (Pended)        Time  10 Minutes  (Pended)          Interval Training   Interval Training  No  (Pended)          NuStep   Level  2  (Pended)        SPM  85  (Pended)        Minutes  10  (Pended)        METs  2.8  (Pended)          Arm Ergometer   Level  2  (Pended)        Minutes  10  (Pended)        METs  2.34  (Pended)          Track   Laps   10  (Pended)        Minutes  10  (Pended)        METs  2.74  (Pended)          Home Exercise Plan   Plans to continue exercise at  Home (comment)  (Pended)        Frequency  Add  1 additional day to program exercise sessions.  (Pended)        Initial Home Exercises Provided  11/03/17  (Pended)           Exercise Comments: Exercise Comments    Row Name 11/03/17 1527           Exercise Comments  Reviewed home exercise guidelnes, METs and goals.          Exercise Goals and Review: Exercise Goals    Row Name 10/02/17 1535             Exercise Goals   Increase Physical Activity  Yes       Intervention  Provide advice, education, support and counseling about physical activity/exercise needs.;Develop an individualized exercise prescription for aerobic and resistive training based on initial evaluation findings, risk stratification, comorbidities and participant's personal goals.       Expected Outcomes  Achievement of increased cardiorespiratory fitness and enhanced flexibility, muscular endurance and strength shown through measurements of functional capacity and personal statement of participant.       Increase Strength and Stamina  Yes       Intervention  Provide advice, education, support and counseling about physical activity/exercise needs.;Develop an individualized exercise prescription for aerobic and resistive training based on initial evaluation findings, risk stratification, comorbidities and participant's personal goals.       Expected Outcomes  Achievement of increased cardiorespiratory fitness and enhanced flexibility, muscular endurance and strength shown through measurements of functional capacity and personal statement of participant.       Able to understand and use rate of perceived exertion (RPE) scale  Yes       Intervention  Provide education and explanation on how to use RPE scale       Expected Outcomes  Short Term: Able to use RPE daily in rehab to express  subjective intensity level;Long Term:  Able to use RPE to guide intensity level when exercising independently       Knowledge and understanding of Target Heart Rate Range (THRR)  Yes       Intervention  Provide education and explanation of THRR including how the numbers were predicted and where they are located for reference       Expected Outcomes  Short Term: Able to state/look up THRR;Short Term: Able to use daily as guideline for intensity in rehab;Long Term: Able to use THRR to govern intensity when exercising independently       Able to check pulse independently  Yes       Intervention  Provide education and demonstration on how to check pulse in carotid and radial arteries.;Review the importance of being able to check your own pulse for safety during independent exercise       Expected Outcomes  Long Term: Able to check pulse independently and accurately;Short Term: Able to explain why pulse checking is important during independent exercise       Understanding of Exercise Prescription  Yes       Intervention  Provide education, explanation, and written materials on patient's individual exercise prescription       Expected Outcomes  Short Term: Able to explain program exercise prescription;Long Term: Able to explain home exercise prescription to exercise independently          Exercise Goals Re-Evaluation : Exercise Goals Re-Evaluation    Row Name 11/04/17 0756             Exercise Goal Re-Evaluation   Exercise Goals Review  Understanding of Exercise Prescription;Knowledge and understanding of Target Heart Rate Range (THRR);Able to understand and use rate of perceived exertion (RPE) scale;Increase Physical Activity       Comments  Reviewed home exercise guidelines with patient including THRR, RPE scale and endpoints for exercise. Pt plans to walk as her mode of home exercise.       Expected Outcomes  Patient will add 1-2 days walking 30 minutes in addition to exercise at cardiac rehab.            Discharge Exercise Prescription (Final Exercise Prescription Changes): Exercise Prescription Changes - 11/03/17 1456      Response to Exercise   Blood Pressure (Admit)  116/70  (Pended)     Blood Pressure (Exercise)  118/68  (Pended)     Blood Pressure (Exit)  104/60  (Pended)     Heart Rate (Admit)  84 bpm  (Pended)     Heart Rate (Exercise)  105 bpm  (Pended)     Heart Rate (Exit)  84 bpm  (Pended)     Rating of Perceived Exertion (Exercise)  13  (Pended)     Symptoms  none  (Pended)     Duration  Progress to 30 minutes of  aerobic without signs/symptoms of physical distress  (Pended)     Intensity  THRR unchanged  (Pended)       Progression   Progression  Continue to progress workloads to maintain intensity without signs/symptoms of physical distress.  (Pended)     Average METs  2.6  (Pended)       Resistance Training   Training Prescription  Yes  (Pended)     Weight  2lbs  (Pended)     Reps  10-15  (Pended)     Time  10 Minutes  (Pended)       Interval Training   Interval Training  No  (Pended)       NuStep   Level  2  (Pended)     SPM  85  (Pended)     Minutes  10  (Pended)     METs  2.8  (Pended)       Arm Ergometer   Level  2  (Pended)     Minutes  10  (Pended)     METs  2.34  (Pended)       Track   Laps  10  (Pended)     Minutes  10  (Pended)     METs  2.74  (Pended)       Home Exercise Plan   Plans to continue exercise at  Home (comment)  (Pended)     Frequency  Add 1 additional day to program exercise sessions.  (Pended)     Initial Home Exercises Provided  11/03/17  (Pended)        Nutrition:  Target Goals: Understanding of nutrition guidelines, daily intake of sodium 1500mg , cholesterol 200mg , calories 30% from fat and 7% or less from saturated fats, daily to have 5 or more servings of fruits and vegetables.  Biometrics: Pre Biometrics - 10/02/17 1607      Pre Biometrics   Height  5\' 5"  (1.651 m)    Weight  212 lb 8.4 oz (96.4 kg)     Waist Circumference  37.5 inches    Hip Circumference  47 inches    Waist to Hip Ratio  0.8 %    BMI (Calculated)  35.37    Triceps Skinfold  48 mm    %  Body Fat  46.5 %    Grip Strength  21 kg    Flexibility  12 in    Single Leg Stand  3.56 seconds        Nutrition Therapy Plan and Nutrition Goals: Nutrition Therapy & Goals - 10/02/17 1527      Nutrition Therapy   Diet  Carb Modified, Heart Healthy      Personal Nutrition Goals   Nutrition Goal  Pt to identify food quantities necessary to achieve weight loss of 6-24 lb (2.7-10.9 kg) at graduation from cardiac rehab. Goal wt of less than 200 lb desired.     Personal Goal #2  Improved blood glucose control as evidenced by pt's A1c trending from 7.6 toward less than 7.0.    Personal Goal #3  Pt able to name foods that affect blood glucose       Intervention Plan   Intervention  Prescribe, educate and counsel regarding individualized specific dietary modifications aiming towards targeted core components such as weight, hypertension, lipid management, diabetes, heart failure and other comorbidities.    Expected Outcomes  Short Term Goal: Understand basic principles of dietary content, such as calories, fat, sodium, cholesterol and nutrients.;Long Term Goal: Adherence to prescribed nutrition plan.       Nutrition Assessments:   Nutrition Goals Re-Evaluation:   Nutrition Goals Re-Evaluation:   Nutrition Goals Discharge (Final Nutrition Goals Re-Evaluation):   Psychosocial: Target Goals: Acknowledge presence or absence of significant depression and/or stress, maximize coping skills, provide positive support system. Participant is able to verbalize types and ability to use techniques and skills needed for reducing stress and depression.  Initial Review & Psychosocial Screening: Initial Psych Review & Screening - 10/02/17 1551      Initial Review   Current issues with  None Identified      Family Dynamics   Good Support  System?  Yes Aarohi has her sister and son for support      Barriers   Psychosocial barriers to participate in program  There are no identifiable barriers or psychosocial needs.      Screening Interventions   Interventions  Encouraged to exercise       Quality of Life Scores: Quality of Life - 10/31/17 1445      Quality of Life Scores   Health/Function Pre  26.14 %    Socioeconomic Pre  28.29 %    Psych/Spiritual Pre  30 %    Family Pre  25.5 %    GLOBAL Pre  27.38 %      Scores of 19 and below usually indicate a poorer quality of life in these areas.  A difference of  2-3 points is a clinically meaningful difference.  A difference of 2-3 points in the total score of the Quality of Life Index has been associated with significant improvement in overall quality of life, self-image, physical symptoms, and general health in studies assessing change in quality of life.  PHQ-9: Recent Review Flowsheet Data    Depression screen [facility] 2/9 10/29/2017   Decreased Interest 0   Down, Depressed, Hopeless 0   PHQ - 2 Score 0     Interpretation of Total Score  Total Score Depression Severity:  1-4 = Minimal depression, 5-9 = Mild depression, 10-14 = Moderate depression, 15-19 = Moderately severe depression, 20-27 = Severe depression   Psychosocial Evaluation and Intervention:   Psychosocial Re-Evaluation: Psychosocial Re-Evaluation    Row Name 10/31/17 1624 11/13/17 1340  Psychosocial Re-Evaluation   Current issues with  Current Stress Concerns  Current Stress Concerns      Comments  - quality of life questionnaire reviewed with patient. Trenesha reports feeling better since her recent stent placement.  Julissa was was hospitalized yesterday and recathed. Exercise is on hold for now      Expected Outcomes  Yana will continue to particpate in cardiac rehab  Lakechia will continue to particpate in cardiac rehab      Interventions  Stress management education;Relaxation education;Encouraged to  attend Cardiac Rehabilitation for the exercise  Stress management education        Initial Review   Source of Stress Concerns  Chronic Illness  Chronic Illness         Psychosocial Discharge (Final Psychosocial Re-Evaluation): Psychosocial Re-Evaluation - 11/13/17 1340      Psychosocial Re-Evaluation   Current issues with  Current Stress Concerns    Comments  Ledora was was hospitalized yesterday and recathed. Exercise is on hold for now    Expected Outcomes  Marquia will continue to particpate in cardiac rehab    Interventions  Stress management education      Initial Review   Source of Stress Concerns  Chronic Illness       Vocational Rehabilitation: Provide vocational rehab assistance to qualifying candidates.   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 Participant will state understanding/return demonstration of topics presented.  Learning Barriers/Preferences: Learning Barriers/Preferences - 10/02/17 1535      Learning Barriers/Preferences   Learning Preferences  Individual Instruction;Group Instruction;Pictoral;Skilled Demonstration       Education Topics: Count Your Pulse:  -Group instruction provided by verbal instruction, demonstration, patient participation and written materials to support subject.  Instructors address importance of being able to find your pulse and how to count your pulse when at home without a heart monitor.  Patients get hands on experience counting their pulse with staff help and individually.   Heart Attack, Angina, and Risk Factor Modification:  -Group instruction provided by verbal instruction,  video, and written materials to support subject.  Instructors address signs and symptoms of angina and heart attacks.    Also discuss risk factors for heart disease and how to make changes to improve heart health risk factors.   Functional Fitness:  -Group instruction provided by verbal instruction, demonstration, patient participation, and written materials to support subject.  Instructors address safety measures for doing things around the house.  Discuss how to get up and down off the floor, how to pick things up properly, how to safely get out of a chair without assistance, and balance training.   Meditation and Mindfulness:  -Group instruction provided by verbal instruction, patient participation, and written materials to support subject.  Instructor addresses importance of mindfulness and meditation practice to help reduce stress and improve awareness.  Instructor also leads participants through a meditation exercise.    Stretching for Flexibility and Mobility:  -Group instruction provided by verbal instruction, patient participation, and written materials to support subject.  Instructors lead participants through series of stretches that are designed to increase flexibility thus improving mobility.  These stretches are  additional exercise for major muscle groups that are typically performed during regular warm up and cool down.   Hands Only CPR:  -Group verbal, video, and participation provides a basic overview of AHA guidelines for community CPR. Role-play of emergencies allow participants the opportunity to practice calling for help and chest compression technique with discussion of AED use.   Hypertension: -Group verbal and written instruction that provides a basic overview of hypertension including the most recent diagnostic guidelines, risk factor reduction with self-care instructions and medication management.    Nutrition I class: Heart Healthy Eating:  -Group instruction  provided by PowerPoint slides, verbal discussion, and written materials to support subject matter. The instructor gives an explanation and review of the Therapeutic Lifestyle Changes diet recommendations, which includes a discussion on lipid goals, dietary fat, sodium, fiber, plant stanol/sterol esters, sugar, and the components of a well-balanced, healthy diet.   CARDIAC REHAB PHASE II ORIENTATION from 10/02/2017 in [facility] CARDIAC REHAB  Date  09/25/17  Educator  RD  Instruction Review Code (Retired)  Not applicable      Nutrition II class: Lifestyle Skills:  -Group instruction provided by [employer], verbal discussion, and written materials to support subject matter. The instructor gives an explanation and review of label reading, grocery shopping for heart health, heart healthy recipe modifications, and ways to make healthier choices when eating out.   CARDIAC REHAB PHASE II ORIENTATION from 10/02/2017 in [facility] CARDIAC REHAB  Date  10/02/17  Educator  RD  Instruction Review Code (Retired)  Not applicable      Diabetes Question & Answer:  -Group instruction provided by PowerPoint slides, verbal discussion, and written materials to support subject matter. The instructor gives an explanation and review of diabetes co-morbidities, pre- and post-prandial blood glucose goals, pre-exercise blood glucose goals, signs, symptoms, and treatment of hypoglycemia and hyperglycemia, and foot care basics.   Diabetes Blitz:  -Group instruction provided by PowerPoint slides, verbal discussion, and written materials to support subject matter. The instructor gives an explanation and review of the physiology behind type 1 and type 2 diabetes, diabetes medications and rational behind using different medications, pre- and post-prandial blood glucose recommendations and Hemoglobin A1c goals, diabetes diet, and exercise including blood glucose guidelines for  exercising safely.    Portion Distortion:  -Group instruction provided by PowerPoint slides, verbal discussion, written materials, and food models to support subject matter. The instructor gives an explanation of serving size versus portion size, changes in portions sizes over the last 20 years, and what consists of a serving from each food group.   Stress Management:  -Group instruction provided by verbal instruction, video, and written materials to support subject matter.  Instructors review role of stress in heart disease and how to cope with stress positively.     Exercising on Your Own:  -Group instruction provided by verbal instruction, power point, and written materials to support subject.  Instructors discuss benefits of exercise, components of exercise, frequency and intensity of exercise, and end points for exercise.  Also discuss use of nitroglycerin and activating EMS.  Review options of places to exercise outside of rehab.  Review guidelines for sex with heart disease.   Cardiac Drugs I:  -Group instruction provided by verbal instruction and written materials to support subject.  Instructor reviews cardiac drug classes: antiplatelets, anticoagulants, beta blockers, and statins.  Instructor discusses reasons, side effects, and lifestyle considerations for each drug class.   Cardiac Drugs II:  -  Group instruction provided by verbal instruction and written materials to support subject.  Instructor reviews cardiac drug classes: angiotensin converting enzyme inhibitors (ACE-I), angiotensin II receptor blockers (ARBs), nitrates, and calcium channel blockers.  Instructor discusses reasons, side effects, and lifestyle considerations for each drug class.   Anatomy and Physiology of the Circulatory System:  Group verbal and written instruction and models provide basic cardiac anatomy and physiology, with the coronary electrical and arterial systems. Review of: AMI, Angina, Valve disease,  Heart Failure, Peripheral Artery Disease, Cardiac Arrhythmia, Pacemakers, and the ICD.   Other Education:  -Group or individual verbal, written, or video instructions that support the educational goals of the cardiac rehab program.   Holiday Eating Survival Tips:  -Group instruction provided by PowerPoint slides, verbal discussion, and written materials to support subject matter. The instructor gives patients tips, tricks, and techniques to help them not only survive but enjoy the holidays despite the onslaught of food that accompanies the holidays.   Knowledge Questionnaire Score: Knowledge Questionnaire Score - 10/02/17 1528      Knowledge Questionnaire Score   Pre Score  23/24       Core Components/Risk Factors/Patient Goals at Admission: Personal Goals and Risk Factors at Admission - 10/02/17 1417      Core Components/Risk Factors/Patient Goals on Admission    Weight Management  Yes    Intervention  Weight Management: Develop a combined nutrition and exercise program designed to reach desired caloric intake, while maintaining appropriate intake of nutrient and fiber, sodium and fats, and appropriate energy expenditure required for the weight goal.;Weight Management: Provide education and appropriate resources to help participant work on and attain dietary goals.;Weight Management/Obesity: Establish reasonable short term and long term weight goals.;Obesity: Provide education and appropriate resources to help participant work on and attain dietary goals.    Expected Outcomes  Short Term: Continue to assess and modify interventions until short term weight is achieved;Long Term: Adherence to nutrition and physical activity/exercise program aimed toward attainment of established weight goal;Weight Maintenance: Understanding of the daily nutrition guidelines, which includes 25-35% calories from fat, 7% or less cal from saturated fats, less than 200mg  cholesterol, less than 1.5gm of sodium, & 5  or more servings of fruits and vegetables daily;Weight Loss: Understanding of general recommendations for a balanced deficit meal plan, which promotes 1-2 lb weight loss per week and includes a negative energy balance of [phone] kcal/d;Understanding recommendations for meals to include 15-35% energy as protein, 25-35% energy from fat, 35-60% energy from carbohydrates, less than 200mg  of dietary cholesterol, 20-35 gm of total fiber daily;Understanding of distribution of calorie intake throughout the day with the consumption of 4-5 meals/snacks    Diabetes  Yes    Intervention  Provide education about signs/symptoms and action to take for hypo/hyperglycemia.;Provide education about proper nutrition, including hydration, and aerobic/resistive exercise prescription along with prescribed medications to achieve blood glucose in normal ranges: Fasting glucose 65-99 mg/dL    Expected Outcomes  Short Term: Participant verbalizes understanding of the signs/symptoms and immediate care of hyper/hypoglycemia, proper foot care and importance of medication, aerobic/resistive exercise and nutrition plan for blood glucose control.;Long Term: Attainment of HbA1C < 7%.    Lipids  Yes    Intervention  Provide education and support for participant on nutrition & aerobic/resistive exercise along with prescribed medications to achieve LDL 70mg , HDL >40mg .    Expected Outcomes  Short Term: Participant states understanding of desired cholesterol values and is compliant with medications prescribed. Participant is following exercise  prescription and nutrition guidelines.;Long Term: Cholesterol controlled with medications as prescribed, with individualized exercise RX and with personalized nutrition plan. Value goals: LDL < 70mg , HDL > 40 mg.       Core Components/Risk Factors/Patient Goals Review:  Goals and Risk Factor Review    Row Name 11/13/17 1337             Core Components/Risk Factors/Patient Goals Review    Personal Goals Review  Weight Management/Obesity;Hypertension;Stress       Review  Chasya had been doing well with exercise to cardiac rehab upon her retrun       Expected Outcomes  Ruweyda's cardiac rehab is on hold due to her hospitalization she is being discharged today          Core Components/Risk Factors/Patient Goals at Discharge (Final Review):  Goals and Risk Factor Review - 11/13/17 1337      Core Components/Risk Factors/Patient Goals Review   Personal Goals Review  Weight Management/Obesity;Hypertension;Stress    Review  Azyiah had been doing well with exercise to cardiac rehab upon her retrun    Expected Outcomes  Jonte's cardiac rehab is on hold due to her hospitalization she is being discharged today       ITP Comments: ITP Comments    Row Name 10/02/17 1413 10/15/17 1748 11/13/17 1336       ITP Comments  Dr Armanda Magic, Medical Director  30 Day ITP review. Casimira's cardiac rehab is on hold as she is scheduled for catherization on Friday.  30 Day ITP review. Milayah's cardiac rehab is on hold as she had a recatherization yesterday        Comments: See ITP comments. Dekayla's exercise is on hold due to her current hospitalization.Gladstone Lighter, RN,BSN 11/13/2017 1:45 PM

## 2017-11-13 NOTE — Discharge Summary (Signed)
Discharge Summary    Patient ID: Mia Gonzales,  MRN: [id number], DOB/AGE: 1950-12-15 67 y.o.  Admit date: 11/12/2017 Discharge date: 11/13/2017  Primary Care Provider: Caffie Damme Primary Cardiologist: Chilton Si, MD  Discharge Diagnoses    Principal Problem:   Progressive angina [facility]) Active Problems:   Coronary artery disease involving native coronary artery of native heart with unstable angina pectoris (HCC)   Allergies Allergies  Allergen Reactions  . Povidone-Iodine Swelling    Swelling locally when used around eye   . Tramadol-Acetaminophen     Other reaction(s): Vomiting  . Codeine Palpitations  . Fentanyl Itching    Diagnostic Studies/Procedures    Left Heart Catheterization 11/12/2017:  Culprit lesion: Dist LAD lesion is 100% stenosed at the site of previous angioplasty. Plan for now is medical management.  Previously placed Mid LAD drug eluting stent is widely patent.  Mid RCA stent, widely patent .  Ost 1st Mrg lesion is 55% stenosed.  LV end diastolic pressure is normal.   Stable coronary disease with the exception of restenosis of the previously angioplastied apical LAD.  Based on the distal nature of this occlusion and the size of the vessel at this point, it is probably not a good target for redo angioplasty or stenting. Plan will be medical management --will increase metoprolol dose to 50 mg and add low-dose amlodipine to the already increased dose of Imdur 60 mg daily.  Also add Ranexa 500 mg twice daily.  Because the patient was having 6 out of 10 chest pain upon completion of the procedure, I felt it prudent to place her under observation status overnight on a nitroglycerin infusion while we are titrating up her antianginal regimen.  She would likely be ready for discharge tomorrow morning if she is stable.  She will be following up with Dr. Duke Salvia on discharge.   Continue aspirin Plavix (was converted from Brilinta to Plavix because  of dyspnea symptoms) _____________   History of Present Illness     Mia Gonzales is a 67 y.o. female who presents for ongoing assessment and management of coronary artery disease, status post drug-eluting stent to the mid RCA in November 2018 with nonobstructive disease elsewhere within the coronary tree, hyperlipidemia, history of prior tobacco abuse, history of right renal carcinoma status post radical nephrectomy, and diabetes.Due to recurrent symptoms of DOE and chest pressure, she was scheduled for OP cardiac catheterization.  She comes 11/11/17 with recurrent symptoms. She had been going to cardiac rehabilitation; on Friday, Feb 15th, 2019 she began to have discomfort in her chest radiating down her right arm and into her right jaw while exercising. He was unable to complete her exercise. She became very fatigued Friday afternoon and slept most of the day Saturday. She states that she's he recurrent complaints of discomfort jaw pain and right arm pain exactly like she was feeling her to her stent placement in January 2019. She chose to ignore this, until her sister insisted that she come to the office today to be evaluated.  She states just walking back to the exam room today she was exhausted. She is complaining of some right jaw pain and some pain in the pocket of her neck currently. She has been medically compliant and has been taking isosorbide as directed as well. The pain does not occur with deep breathing.    Hospital Course     Consultants: None    1. Unstable angina with history of CAD s/p stent: patient presented  with ongoing chest pain radiating down her right arm and into her right jaw while exercising, as well as increased fatigue. EKG with NSR, no STE/D, some non-specific T wave abnormalities; unchanged from previous. Decision made to undergo repeat cardiac catheterization - s/p LHC 11/12/17 which revealed restenosis of the previously angioplastied apical LAD, no amenable to  further angioplasty or stenting given size of the vessel. Patient recommended for medical management with continued imdur 60mg  daily, increased metoprolol XL 50mg  daily, addition of amlodipine 2.5mg  daily and ranexa 500mg  BID. - Continue Statin, ASA, and plavix (intolerant to brilinta 2/2 SOB) - Continue prn SL nitro  2. HTN: BP stable throughout admission - Continue metoprolol, amlodpine, imdur  3. Dyslipidemia: - Continue statin  _____________  Discharge Vitals Blood pressure (!) 121/54, pulse 63, temperature (!) 97.5 F (36.4 C), temperature source Oral, resp. rate 17, height 5\' 7"  (1.702 m), weight 212 lb (96.2 kg), SpO2 95 %.  Filed Weights   11/12/17 1419  Weight: 212 lb (96.2 kg)    Labs & Radiologic Studies    CBC Recent Labs    11/11/17 1610  WBC 10.3  HGB 13.4  HCT 39.9  MCV 82  PLT 217   Basic Metabolic Panel No results for input(s): NA, K, CL, CO2, GLUCOSE, BUN, CREATININE, CALCIUM, MG, PHOS in the last 72 hours. Liver Function Tests No results for input(s): AST, ALT, ALKPHOS, BILITOT, PROT, ALBUMIN in the last 72 hours. No results for input(s): LIPASE, AMYLASE in the last 72 hours. Cardiac Enzymes No results for input(s): CKTOTAL, CKMB, CKMBINDEX, TROPONINI in the last 72 hours. BNP Invalid input(s): POCBNP D-Dimer No results for input(s): DDIMER in the last 72 hours. Hemoglobin A1C No results for input(s): HGBA1C in the last 72 hours. Fasting Lipid Panel No results for input(s): CHOL, HDL, LDLCALC, TRIG, CHOLHDL, LDLDIRECT in the last 72 hours. Thyroid Function Tests No results for input(s): TSH, T4TOTAL, T3FREE, THYROIDAB in the last 72 hours.  Invalid input(s): FREET3 _____________  No results found. Disposition   Patient was seen and examined by Dr. Antoine Poche who deemed patient as stable for discharge. Follow-up has been arranged. Discharge medications as listed below.   Follow-up Plans & Appointments    Follow-up Information    Jodelle Gross, NP Follow up on 11/25/2017.   Specialties:  Nurse Practitioner, Radiology, Cardiology Why:  Please arrive 15 minutes early for you 9am appointment Contact information: [address] [place] [address] [phone]          Discharge Instructions    Diet - low sodium heart healthy   Complete by:  As directed    Discharge instructions   Complete by:  As directed    PLEASE REMEMBER TO BRING ALL OF YOUR MEDICATIONS TO EACH OF YOUR FOLLOW-UP OFFICE VISITS.  PLEASE ATTEND ALL SCHEDULED FOLLOW-UP APPOINTMENTS.   Activity: Increase activity slowly as tolerated. You may shower, but no soaking baths (or swimming) for 1 week. No driving for 24 hours. No lifting over 5 lbs for 1 week. No sexual activity for 1 week.   You May Return to Work: in 1 week (if applicable)  Wound Care: You may wash cath site gently with soap and water. Keep cath site clean and dry. If you notice pain, swelling, bleeding or pus at your cath site, please call [phone].   Increase activity slowly   Complete by:  As directed       Discharge Medications   Allergies as of 11/13/2017  Reactions   Povidone-iodine Swelling   Swelling locally when used around eye   Tramadol-acetaminophen    Other reaction(s): Vomiting   Codeine Palpitations   Fentanyl Itching      Medication List    TAKE these medications   acetaminophen 500 MG tablet Commonly known as:  TYLENOL Take 500 mg by mouth every 6 (six) hours as needed for mild pain (takes 1 tablet every morning).   amLODipine 2.5 MG tablet Commonly known as:  NORVASC Take 1 tablet (2.5 mg total) by mouth daily. Start taking on:  11/14/2017   aspirin EC 81 MG tablet Take 81 mg by mouth daily.   atorvastatin 80 MG tablet Commonly known as:  LIPITOR Take 1 tablet (80 mg total) by mouth daily at 6 PM.   clopidogrel 75 MG tablet Commonly known as:  PLAVIX Take 1 tablet (75 mg total) by mouth daily.   CoQ10 100 MG Caps Take 1  capsule by mouth daily. What changed:  how much to take   gabapentin 300 MG capsule Commonly known as:  NEURONTIN Take 600-900 mg by mouth 2 (two) times daily. Takes 600 mg in the morning and 900 mg at bedtime   ICAPS AREDS 2 PO Take 1 capsule by mouth 2 (two) times daily.   isosorbide mononitrate 60 MG 24 hr tablet Commonly known as:  IMDUR Take 1 tablet (60 mg total) by mouth daily.   metoprolol succinate 50 MG 24 hr tablet Commonly known as:  TOPROL-XL Take 1 tablet (50 mg total) by mouth daily. Take with or immediately following a meal. Start taking on:  11/14/2017 What changed:    medication strength  how much to take  additional instructions   nitroGLYCERIN 0.4 MG SL tablet Commonly known as:  NITROSTAT Place 1 tablet (0.4 mg total) under the tongue every 5 (five) minutes as needed for chest pain.   ranolazine 500 MG 12 hr tablet Commonly known as:  RANEXA Take 1 tablet (500 mg total) by mouth 2 (two) times daily.   venlafaxine XR 75 MG 24 hr capsule Commonly known as:  EFFEXOR-XR Take 75 mg by mouth 2 (two) times daily.   VITAMIN D PO Take 1 tablet by mouth daily.        Aspirin prescribed at discharge?  Yes High Intensity Statin Prescribed? (Lipitor 40-80mg  or Crestor 20-40mg ): Yes Beta Blocker Prescribed? Yes For EF <40%, was ACEI/ARB Prescribed? No: EF 65-70% ADP Receptor Inhibitor Prescribed? (i.e. Plavix etc.-Includes Medically Managed Patients): Yes For EF <40%, Aldosterone Inhibitor Prescribed? No: EF 65-70% Was EF assessed during THIS hospitalization? No: Evaluated on prior recent admission Was Cardiac Rehab II ordered? (Included Medically managed Patients): Yes   Outstanding Labs/Studies   None  Duration of Discharge Encounter   Greater than 30 minutes including physician time.  Signed, Beatriz Stallion PA-C 11/13/2017, 1:15 PM

## 2017-11-14 ENCOUNTER — Encounter (HOSPITAL_COMMUNITY): Payer: Medicare HMO

## 2017-11-14 ENCOUNTER — Encounter: Payer: Self-pay | Admitting: *Deleted

## 2017-11-14 ENCOUNTER — Telehealth: Payer: Self-pay | Admitting: Adult Health

## 2017-11-14 NOTE — Telephone Encounter (Signed)
New Message   Patient is calling because she needs a letter to return to work. Please call to discuss.

## 2017-11-14 NOTE — Telephone Encounter (Signed)
Discussed with Dr Duke Salvia and ok to return to work Monday, no lifting over 10 pounds until 5 days after cath Patient aware letter ready for pick up

## 2017-11-14 NOTE — Telephone Encounter (Signed)
Spoke with patient and she does office work for [employer] and would like to return to work Monday 11/17/17

## 2017-11-14 NOTE — Telephone Encounter (Signed)
Follow Up  Pt checking on the letter for work she requested. Please call

## 2017-11-14 NOTE — Telephone Encounter (Signed)
Patient had cath 11/12/17 Would like a note to return to work Routed to [place] LPN to address with MD

## 2017-11-17 ENCOUNTER — Encounter (HOSPITAL_COMMUNITY): Payer: Medicare HMO

## 2017-11-19 ENCOUNTER — Encounter (HOSPITAL_COMMUNITY): Payer: Medicare HMO

## 2017-11-21 ENCOUNTER — Encounter (HOSPITAL_COMMUNITY): Payer: Medicare HMO

## 2017-11-24 ENCOUNTER — Encounter (HOSPITAL_COMMUNITY): Payer: Medicare HMO

## 2017-11-24 NOTE — Progress Notes (Signed)
Cardiology Office Note   Date:  11/25/2017   ID:  Mia Gonzales, DOB November 02, 1950, MRN [id number]  PCP:  Caffie Damme, MD  Cardiologist: Dr.Hammond   Chief Complaint  Patient presents with  . Coronary Artery Disease  . Shortness of Breath     History of Present Illness: Mia Gonzales is a 67 y.o. female who presents for ongoing assessment and management of known coronary artery disease, with history of drug-eluting stent to the right coronary artery in November 2018, with nonobstructive disease elsewhere within the coronary arteries. The patient has other history to include hyperlipidemia, right renal carcinoma status post radical nephrectomy, diabetes, and prior history of tobacco abuse.  The patient was having ongoing symptoms for recurrent chest discomfort and dyspnea and was scheduled for an outpatient cardiac catheterization. This revealed distal LAD lesion of 100% stenosis at the site of previous angioplasty, there RCA stent was widely patent, ostial first marginal lesion was 55% stenosed. The previously placed LAD DES was widely patent. He was planned for ongoing medical therapy only.  She is here today with occasional chest discomfort. Nothing severe or keeping her from ADLs are working. She is requesting samples of ranolazine as he has become too expensive for her with her numerous hospital bills and physician follow-up bills   He is planning on moving to [place] in May. She has already planned to see cardiologist there, her sister is seeing one also in [place] and will try and get in the same practice.  Past Medical History:  Diagnosis Date  . Anginal pain (HCC)   . Asthma 01/06/2017  . Coronary artery disease   . Essential hypertension 01/06/2017  . Fibromyalgia   . Pleuritic chest pain 01/06/2017  . Pre-diabetes   . Shortness of breath 01/06/2017    Past Surgical History:  Procedure Laterality Date  . CARDIAC CATHETERIZATION  08/18/2017   S/P DES RCA  Dr Eldridge Dace  . CHOLECYSTECTOMY      . CORONARY STENT INTERVENTION N/A 08/18/2017   Procedure: CORONARY STENT INTERVENTION;  Surgeon: Corky Crafts, MD;  Location[facility] INVASIVE CV LAB;  Service: Cardiovascular;  Laterality: N/A;  . CORONARY STENT INTERVENTION N/A 10/17/2017   Procedure: CORONARY STENT INTERVENTION;  Surgeon: Marykay Lex, MD;  Location: [facility] INVASIVE CV LAB;  Service: Cardiovascular;  Laterality: N/A;  . LEFT HEART CATH AND CORONARY ANGIOGRAPHY N/A 08/18/2017   Procedure: LEFT HEART CATH AND CORONARY ANGIOGRAPHY;  Surgeon: Corky Crafts, MD;  Location: [facility] INVASIVE CV LAB;  Service: Cardiovascular;  Laterality: N/A;  . LEFT HEART CATH AND CORONARY ANGIOGRAPHY N/A 10/17/2017   Procedure: LEFT HEART CATH AND CORONARY ANGIOGRAPHY;  Surgeon: Marykay Lex, MD;  Location: [facility] INVASIVE CV LAB;  Service: Cardiovascular;  Laterality: N/A;  . LEFT HEART CATH AND CORONARY ANGIOGRAPHY N/A 11/12/2017   Procedure: LEFT HEART CATH AND CORONARY ANGIOGRAPHY;  Surgeon: Marykay Lex, MD;  Location: [facility] INVASIVE CV LAB;  Service: Cardiovascular;  Laterality: N/A;  . NEPHRECTOMY       Current Outpatient Medications  Medication Sig Dispense Refill  . acetaminophen (TYLENOL) 500 MG tablet Take 500 mg by mouth every 6 (six) hours as needed for mild pain (takes 1 tablet every morning).    Marland Kitchen amLODipine (NORVASC) 2.5 MG tablet Take 1 tablet (2.5 mg total) by mouth daily. 30 tablet 5  . aspirin EC 81 MG tablet Take 81 mg by mouth daily.    Marland Kitchen atorvastatin (LIPITOR) 80 MG tablet Take 1 tablet (80 mg total)  by mouth daily at 6 PM. 90 tablet 1  . Cholecalciferol (VITAMIN D PO) Take 1 tablet by mouth daily.    . clopidogrel (PLAVIX) 75 MG tablet Take 1 tablet (75 mg total) by mouth daily. 90 tablet 1  . Coenzyme Q10 (COQ10) 100 MG CAPS Take 1 capsule by mouth daily. (Patient taking differently: Take 100 mg by mouth daily. ) 30 each   . gabapentin (NEURONTIN) 300 MG capsule Take 600-900 mg by mouth 2 (two) times daily. Takes  600 mg in the morning and 900 mg at bedtime    . isosorbide mononitrate (IMDUR) 60 MG 24 hr tablet Take 1 tablet (60 mg total) by mouth daily. 30 tablet 3  . metoprolol succinate (TOPROL-XL) 50 MG 24 hr tablet Take 1 tablet (50 mg total) by mouth daily. Take with or immediately following a meal. 90 tablet 3  . Multiple Vitamins-Minerals (ICAPS AREDS 2 PO) Take 1 capsule by mouth 2 (two) times daily.    . ranolazine (RANEXA) 500 MG 12 hr tablet Take 1 tablet (500 mg total) by mouth 2 (two) times daily. 60 tablet 5  . venlafaxine XR (EFFEXOR-XR) 75 MG 24 hr capsule Take 75 mg by mouth 2 (two) times daily.     . nitroGLYCERIN (NITROSTAT) 0.4 MG SL tablet Place 1 tablet (0.4 mg total) under the tongue every 5 (five) minutes as needed for chest pain. 25 tablet 12   No current facility-administered medications for this visit.     Allergies:   Povidone-iodine; Tramadol-acetaminophen; Codeine; and Fentanyl    Social History:  The patient  reports that she has quit smoking. she has never used smokeless tobacco. She reports that she does not drink alcohol or use drugs.   Family History:  The patient's family history includes CAD in her sister; Cancer in her father and mother; Cardiomyopathy in her sister; Diabetes in her father and paternal grandmother; Heart attack in her maternal grandfather and sister; Heart disease in her maternal grandfather and maternal grandmother; Rheum arthritis in her mother.    ROS: All other systems are reviewed and negative. Unless otherwise mentioned in H&P    PHYSICAL EXAM: VS:  BP 120/72   Pulse 69   Ht 5\' 7"  (1.702 m)   Wt 212 lb (96.2 kg)   BMI 33.20 kg/m  , BMI Body mass index is 33.2 kg/m. GEN: Well nourished, well developed, in no acute distress  HEENT: normal  Neck: no JVD, carotid bruits, or masses Cardiac: RRR; no murmurs, rubs, or gallops,no edema  Respiratory:  clear to auscultation bilaterally, normal work of breathing GI: soft, nontender,  nondistended, + BS MS: no deformity or atrophy right wrist has healed well without evidence of ecchymosis bleeding infection. Skin: warm and dry, no rash Neuro:  Strength and sensation are intact Psych: euthymic mood, full affect  Recent Labs: 11/06/2017: ALT 9; BUN 21; Creatinine, Ser 1.24; Potassium 4.6; Sodium 142 11/11/2017: Hemoglobin 13.4; Platelets 217    Lipid Panel    Component Value Date/Time   CHOL 112 11/06/2017 1448   TRIG 252 (H) 11/06/2017 1448   HDL 32 (L) 11/06/2017 1448   CHOLHDL 3.5 11/06/2017 1448   CHOLHDL 6.6 08/17/2017 0015   VLDL 53 (H) 08/17/2017 0015   LDLCALC 30 11/06/2017 1448      Wt Readings from Last 3 Encounters:  11/25/17 212 lb (96.2 kg)  11/12/17 212 lb (96.2 kg)  11/11/17 212 lb 9.6 oz (96.4 kg)      Other  studies Reviewed: CARDIAC CATH:   11/12/2017   Culprit lesion: Dist LAD lesion is 100% stenosed at the site of previous angioplasty. Plan for now is medical management.  Previously placed Mid LAD drug eluting stent is widely patent.  Mid RCA stent, widely patent .  Ost 1st Mrg lesion is 55% stenosed.  LV end diastolic pressure is normal.   Echocardiogram:  08/19/2017 Left ventricle: The cavity size was normal. Wall thickness was   increased in a pattern of moderate LVH. Systolic function was   vigorous. The estimated ejection fraction was in the range of 65%   to 70%. Wall motion was normal; there were no regional wall   motion abnormalities. Doppler parameters are consistent with   abnormal left ventricular relaxation (grade 1 diastolic   dysfunction). The E/e&' ratio is between 8-15, suggesting   indeterminate LV filling pressure. - Aortic valve: Trileaflet. Sclerosis without stenosis. There was   no regurgitation. - Mitral valve: Mildly thickened leaflets . There was trivial   regurgitation. - Left atrium: The atrium was normal in size. - Inferior vena cava: The vessel was normal in size. The   respirophasic diameter  changes were in the normal range (>= 50%),   consistent with normal central venous pressure.  ASSESSMENT AND PLAN:  1.  Coronary artery disease: 3 catheterizations completed within the last 4 months. Patient has DES to the right coronary artery, DES to the mid LAD, with distal LAD 90% stenosis, atherectomy or stenting was not found to be an option at the time of most recent catheterization. He is going to be treated medically with nitrates, ranolazine, aspirin, and continue on Plavix 75 mg daily. The patient is also on rate control with metoprolol along with help with angina and blood pressure control.  I've given her copies of her records so that she may take them with her to her new cardiologist in [place]. She is given symptom samples ranolazine.    Current medicines are reviewed at length with the patient today.    Labs/ tests ordered today include: None Bettey Mare. Liborio Nixon, ANP, AACC   11/25/2017 8:58 AM    [facility] [address] [place], [place] [address] Phone: [phone]; Fax: [phone]

## 2017-11-25 ENCOUNTER — Ambulatory Visit: Payer: Medicare HMO | Admitting: Adult Health

## 2017-11-25 ENCOUNTER — Encounter: Payer: Self-pay | Admitting: Adult Health

## 2017-11-25 VITALS — BP 120/72 | HR 69 | Ht 67.0 in | Wt 212.0 lb

## 2017-11-25 DIAGNOSIS — E7800 Pure hypercholesterolemia, unspecified: Secondary | ICD-10-CM

## 2017-11-25 DIAGNOSIS — I10 Essential (primary) hypertension: Secondary | ICD-10-CM

## 2017-11-25 DIAGNOSIS — I2510 Atherosclerotic heart disease of native coronary artery without angina pectoris: Secondary | ICD-10-CM

## 2017-11-25 NOTE — Patient Instructions (Addendum)
Medication Instructions:  NO CHANGES-Your physician recommends that you continue on your current medications as directed. Please refer to the Current Medication list given to you today.  If you need a refill on your cardiac medications before your next appointment, please call your pharmacy.  Special Instructions: COME BACK THIS AFTERNOON OR TOMORROW FOR REPORT  Follow-Up: Your physician wants you to follow-up in: [place] WITH NEW CARDIOLOGIST  Thank you for choosing [facility] at [facility]!!

## 2017-11-26 ENCOUNTER — Encounter (HOSPITAL_COMMUNITY): Payer: Medicare HMO

## 2017-11-28 ENCOUNTER — Encounter (HOSPITAL_COMMUNITY): Payer: Medicare HMO

## 2017-12-01 ENCOUNTER — Encounter (HOSPITAL_COMMUNITY): Payer: Medicare HMO

## 2017-12-03 ENCOUNTER — Encounter (HOSPITAL_COMMUNITY): Payer: Medicare HMO

## 2017-12-05 ENCOUNTER — Telehealth (HOSPITAL_COMMUNITY): Payer: Self-pay | Admitting: *Deleted

## 2017-12-05 ENCOUNTER — Encounter (HOSPITAL_COMMUNITY): Payer: Self-pay | Admitting: *Deleted

## 2017-12-05 ENCOUNTER — Encounter (HOSPITAL_COMMUNITY): Payer: Medicare HMO

## 2017-12-05 DIAGNOSIS — I214 Non-ST elevation (NSTEMI) myocardial infarction: Secondary | ICD-10-CM

## 2017-12-05 DIAGNOSIS — Z955 Presence of coronary angioplasty implant and graft: Secondary | ICD-10-CM

## 2017-12-05 NOTE — Progress Notes (Signed)
Discharge Progress Report  Patient Details  Name: Mia Gonzales MRN: [id number] Date of Birth: 1951-01-08 Referring Provider:     CARDIAC REHAB PHASE II ORIENTATION from 10/02/2017 in [facility] CARDIAC REHAB  Referring Provider  Chilton Si MD       Number of Visits: 6  Reason for Discharge:  Early Exit:  back to work and relocating to [place]  Smoking History:  Social History   Tobacco Use  Smoking Status Former Smoker  Smokeless Tobacco Never Used    Diagnosis:  NSTEMI (non-ST elevated myocardial infarction) (HCC) 08/16/17  S/P coronary artery stent placement  DES RCA 08/18/17, 10/17/17 DES MID LAD  ADL UCSD:   Initial Exercise Prescription:   Discharge Exercise Prescription (Final Exercise Prescription Changes): Exercise Prescription Changes - 11/07/17 1449      Response to Exercise   Blood Pressure (Admit)  108/70    Blood Pressure (Exercise)  138/80    Blood Pressure (Exit)  108/64    Heart Rate (Admit)  84 bpm    Heart Rate (Exercise)  111 bpm    Heart Rate (Exit)  85 bpm    Rating of Perceived Exertion (Exercise)  14    Symptoms  none    Duration  Progress to 30 minutes of  aerobic without signs/symptoms of physical distress    Intensity  THRR unchanged      Progression   Progression  Continue to progress workloads to maintain intensity without signs/symptoms of physical distress.    Average METs  2.5      Resistance Training   Training Prescription  Yes    Weight  2lbs    Reps  10-15    Time  10 Minutes      Interval Training   Interval Training  No      NuStep   Level  2    SPM  85    Minutes  10    METs  2.3      Arm Ergometer   Level  2    Minutes  10    METs  2.34      Track   Laps  11    Minutes  10    METs  2.91      Home Exercise Plan   Plans to continue exercise at  Home (comment)    Frequency  Add 1 additional day to program exercise sessions.    Initial Home Exercises Provided  11/03/17        Functional Capacity:   Psychological, QOL, Others - Outcomes: PHQ 2/9: Depression screen PHQ 2/9 10/29/2017  Decreased Interest 0  Down, Depressed, Hopeless 0  PHQ - 2 Score 0    Quality of Life: Quality of Life - 10/31/17 1445      Quality of Life Scores   Health/Function Pre  26.14 %    Socioeconomic Pre  28.29 %    Psych/Spiritual Pre  30 %    Family Pre  25.5 %    GLOBAL Pre  27.38 %       Personal Goals: Goals established at orientation with interventions provided to work toward goal.    Personal Goals Discharge: Goals and Risk Factor Review    Row Name 11/13/17 1337             Core Components/Risk Factors/Patient Goals Review   Personal Goals Review  Weight Management/Obesity;Hypertension;Stress       Review  Mia Gonzales had been doing well with exercise  to cardiac rehab upon her retrun       Expected Outcomes  Mia Gonzales's cardiac rehab is on hold due to her hospitalization she is being discharged today          Exercise Goals and Review:   Nutrition & Weight - Outcomes:  Post Biometrics - 11/07/17 1449       Post  Biometrics   Weight  215 lb 13.3 oz (97.9 kg)    BMI (Calculated)  33.8       Nutrition:   Nutrition Discharge:   Education Questionnaire Score:   . Mia Gonzales attended 6 classes  between 10/02/17 and 11/07/17. Mia Gonzales did not return to exercise after her re cath on 11/16/17. Mia Gonzales is moving back to [place] in the near future and will continue her exercise there.Mia Lighter, RN,BSN 12/31/2017 9:19 AM

## 2017-12-08 ENCOUNTER — Encounter (HOSPITAL_COMMUNITY): Payer: Medicare HMO

## 2017-12-10 ENCOUNTER — Encounter (HOSPITAL_COMMUNITY): Payer: Medicare HMO

## 2017-12-12 ENCOUNTER — Encounter (HOSPITAL_COMMUNITY): Payer: Medicare HMO

## 2017-12-15 ENCOUNTER — Encounter (HOSPITAL_COMMUNITY): Payer: Medicare HMO

## 2017-12-17 ENCOUNTER — Encounter (HOSPITAL_COMMUNITY): Payer: Medicare HMO

## 2017-12-19 ENCOUNTER — Encounter (HOSPITAL_COMMUNITY): Payer: Medicare HMO

## 2017-12-22 ENCOUNTER — Encounter (HOSPITAL_COMMUNITY): Payer: Medicare HMO

## 2017-12-23 ENCOUNTER — Telehealth: Payer: Self-pay

## 2017-12-23 NOTE — Telephone Encounter (Signed)
   [facility] Pre-operative Risk Assessment    Request for surgical clearance:  1. What type of surgery is being performed? Cataract extraction with Intraocular Lens implantation of Right and Left eye  2. When is this surgery scheduled? 01/23/18 & 02/06/18  3. What type of clearance is required (medical clearance vs. Pharmacy clearance to hold med vs. Both)? Both  4. Are there any medications that need to be held prior to surgery and how long? Plavix-needing directions  5. Practice name and name of physician performing surgery? [facility]   6. What is your office phone and fax number? [phone]     Fax [phone]  7. Anesthesia type (None, local, MAC, general) ? Topical anesthesia with IV medicaton   Meryl Crutch 12/23/2017, 3:12 PM  _________________________________________________________________   (provider comments below)

## 2017-12-24 ENCOUNTER — Encounter (HOSPITAL_COMMUNITY): Payer: Medicare HMO

## 2017-12-24 NOTE — Addendum Note (Signed)
Encounter addended by: Artist Pais on: 12/24/2017 12:34 PM  Actions taken: Flowsheet accepted, Flowsheet data copied forward, Visit Navigator Flowsheet section accepted, Vitals modified

## 2017-12-26 ENCOUNTER — Encounter (HOSPITAL_COMMUNITY): Payer: Medicare HMO

## 2017-12-26 NOTE — Telephone Encounter (Signed)
This patient had stents placed 09/2017.  She absolutely cannot hold Plavix at this time.  If her cataract surgery cannot be performed on plavix then she will need to wait for 09/2018.

## 2017-12-26 NOTE — Telephone Encounter (Signed)
   Cardiology clearance requested for our procedure.  Patient is also requested to hold Plavix.  He had a drug-eluting stent placed to the RCA 08/18/2017.  Will route this to Dr. Duke Salvia to see if it is okay to hold the Plavix prior to 1 year from his last stent.   Theodore Demark, PA-C 12/26/2017 3:21 PM Beeper [phone]

## 2017-12-26 NOTE — Telephone Encounter (Signed)
Left message for patient to call back and schedule appointment.

## 2017-12-29 ENCOUNTER — Encounter (HOSPITAL_COMMUNITY): Payer: Medicare HMO

## 2017-12-29 NOTE — Telephone Encounter (Signed)
Forwarded to requesting providers office via EIC 

## 2017-12-31 ENCOUNTER — Encounter (HOSPITAL_COMMUNITY): Payer: Medicare HMO

## 2017-12-31 ENCOUNTER — Telehealth: Payer: Self-pay | Admitting: Adult Health

## 2017-12-31 NOTE — Telephone Encounter (Signed)
New Message:     Mia Gonzales states that pt does not have to hold any medications for this procedure. Mia Gonzales ask that we revisit the clearance.

## 2018-01-01 ENCOUNTER — Telehealth: Payer: Self-pay | Admitting: Cardiology

## 2018-01-01 NOTE — Telephone Encounter (Signed)
Spoke with [facility] to make her aware that if pt had to stop ASP / Plavix, that her procedure would have to be postponed. Andrey Campanile thanked me for letting them know.

## 2018-01-01 NOTE — Telephone Encounter (Signed)
   Primary Cardiologist: Chilton Si, MD  Chart reviewed as part of pre-operative protocol coverage. Given past medical history and time since last visit, based on ACC/AHA guidelines, Taheerah Narasimhan would be at acceptable risk for the planned procedure without further cardiovascular testing.   Patient should NOT stop asprin or Plavix due to stent placed in Jan 2019.  If she were to need to stop surgery must be postponed.     I will route this recommendation to the requesting party via Epic fax function and remove from pre-op pool.  Please call with questions.  Nada Boozer, NP 01/01/2018, 3:26 PM

## 2018-01-02 ENCOUNTER — Encounter (HOSPITAL_COMMUNITY): Payer: Medicare HMO

## 2018-01-05 ENCOUNTER — Encounter (HOSPITAL_COMMUNITY): Payer: Medicare HMO

## 2018-01-07 ENCOUNTER — Encounter (HOSPITAL_COMMUNITY): Payer: Medicare HMO

## 2018-01-20 ENCOUNTER — Ambulatory Visit: Payer: Medicare HMO | Admitting: Adult Health

## 2018-04-17 ENCOUNTER — Telehealth: Payer: Self-pay | Admitting: Cardiovascular Disease

## 2018-04-17 NOTE — Telephone Encounter (Signed)
Pt c/o of Chest Pain: STAT if CP now or developed within 24 hours  1. Are you having CP right now? No not at the moment, have earlier this morning  2. Are you experiencing any other symptoms (ex. SOB, nausea, vomiting, sweating)? Short of breath and extremely tired 3. How long have you been experiencing CP? Couple of weeks  4. Is your CP continuous or coming and going? Coming and going  5. Have you taken Nitroglycerin? no ?

## 2018-04-17 NOTE — Telephone Encounter (Signed)
Called patient, she stated that she does not have active chest pains, but more of 'twinges' in the direct center of her chest moving into the right side. Patient states she is fatigued, headache, and SOB. Unable to check BP and HR at the moment, patient denies swelling. Does mention her weight is 220 now. Patient was no distressed on telephone call, and was advised that if the chest discomfort, or if it turned into chest pains to take a nitro as instructed and go to ED. Patient medications were reviewed, she does take all medications as instructed. No other questions or concerns, and verbalized that if it was to began again and get worse she would go to ED. Patient does have appointment with Lorin Picket 04/22/18, patient will keep appointment.

## 2018-04-21 NOTE — Progress Notes (Signed)
Cardiology Office Note   Date:  04/22/2018   ID:  Mia Gonzales, DOB Jan 15, 1951, MRN [id number]  PCP:  Caffie Damme, MD  Cardiologist:  Dr.West Alexander Chief Complaint  Patient presents with  . Follow-up    SOB, does mention chest pains at times.     History of Present Illness: Mia Gonzales is a 67 y.o. female who presents for ongoing assessment and management of CAD, drug-eluting stent to the right coronary artery in November 2018, nonobstructive disease elsewhere within the coronary arteries.  The patient also has a history of hyperlipidemia, right renal carcinoma status post radical nephrectomy, diabetes, and prior history of tobacco abuse.  The patient was last seen in the office on 11/25/2017, post catheterization after she had had ongoing symptoms of recurrent chest discomfort and dyspnea.  Cardiac catheterization revealed distal LAD lesion of 100% stenosis at the site of a previous angioplasty, the RCA stent was widely patent, the ostial first marginal lesion was 55% stenosis.  The previously placed LAD drug-eluting stent was widely patent.  The patient was to continue medical therapy only.  The patient requested samples of were no lives alone as it was expensive for her, and this was provided.  The patient was going to move to [place] and records were provided for her to take with her.  The patient called our office on 04/17/2018 with complaints of recurrent chest pain.  On follow-up phone call the patient states that she did not have active chest pain but more "twinges" in the center of her chest to moving to the right side.  The patient has gained weight.  She was advised to go to the emergency room if she had recurrent chest pain and to take nitroglycerin to see if it would help to subside.  She comes today with some weight gain, and dizziness. She was in a store and had a wave of dizziness while in line. She is very stressed due to moving. She has been driving back and forth to [place] and continuing to  work. She is medically compliant.   Past Medical History:  Diagnosis Date  . Anginal pain (HCC)   . Asthma 01/06/2017  . Coronary artery disease   . Essential hypertension 01/06/2017  . Fibromyalgia   . Pleuritic chest pain 01/06/2017  . Pre-diabetes   . Shortness of breath 01/06/2017    Past Surgical History:  Procedure Laterality Date  . CARDIAC CATHETERIZATION  08/18/2017   S/P DES RCA  Dr Eldridge Dace  . CHOLECYSTECTOMY    . CORONARY STENT INTERVENTION N/A 08/18/2017   Procedure: CORONARY STENT INTERVENTION;  Surgeon: Corky Crafts, MD;  Location: [facility] INVASIVE CV LAB;  Service: Cardiovascular;  Laterality: N/A;  . CORONARY STENT INTERVENTION N/A 10/17/2017   Procedure: CORONARY STENT INTERVENTION;  Surgeon: Marykay Lex, MD;  Location: [facility] INVASIVE CV LAB;  Service: Cardiovascular;  Laterality: N/A;  . LEFT HEART CATH AND CORONARY ANGIOGRAPHY N/A 08/18/2017   Procedure: LEFT HEART CATH AND CORONARY ANGIOGRAPHY;  Surgeon: Corky Crafts, MD;  Location: [facility] INVASIVE CV LAB;  Service: Cardiovascular;  Laterality: N/A;  . LEFT HEART CATH AND CORONARY ANGIOGRAPHY N/A 10/17/2017   Procedure: LEFT HEART CATH AND CORONARY ANGIOGRAPHY;  Surgeon: Marykay Lex, MD;  Location: [facility] INVASIVE CV LAB;  Service: Cardiovascular;  Laterality: N/A;  . LEFT HEART CATH AND CORONARY ANGIOGRAPHY N/A 11/12/2017   Procedure: LEFT HEART CATH AND CORONARY ANGIOGRAPHY;  Surgeon: Marykay Lex, MD;  Location: [facility] INVASIVE CV LAB;  Service:  Cardiovascular;  Laterality: N/A;  . NEPHRECTOMY       Current Outpatient Medications  Medication Sig Dispense Refill  . acetaminophen (TYLENOL) 500 MG tablet Take 500 mg by mouth every 6 (six) hours as needed for mild pain (takes 1 tablet every morning).    Marland Kitchen aspirin EC 81 MG tablet Take 81 mg by mouth daily.    Marland Kitchen atorvastatin (LIPITOR) 80 MG tablet Take 1 tablet (80 mg total) by mouth daily at 6 PM. 90 tablet 1  . Cholecalciferol (VITAMIN D PO) Take 1 tablet  by mouth daily.    . clopidogrel (PLAVIX) 75 MG tablet Take 1 tablet (75 mg total) by mouth daily. 90 tablet 1  . Coenzyme Q10 (COQ10) 100 MG CAPS Take 1 capsule by mouth daily. (Patient taking differently: Take 100 mg by mouth daily. ) 30 each   . gabapentin (NEURONTIN) 300 MG capsule Take 600-900 mg by mouth 2 (two) times daily. Takes 600 mg in the morning and 900 mg at bedtime    . isosorbide mononitrate (IMDUR) 60 MG 24 hr tablet Take 1 tablet (60 mg total) by mouth daily. 30 tablet 3  . metoprolol succinate (TOPROL-XL) 50 MG 24 hr tablet Take 1 tablet (50 mg total) by mouth daily. Take with or immediately following a meal. 90 tablet 3  . Multiple Vitamins-Minerals (ICAPS AREDS 2 PO) Take 1 capsule by mouth 2 (two) times daily.    . ranolazine (RANEXA) 500 MG 12 hr tablet Take 1 tablet (500 mg total) by mouth 2 (two) times daily. 60 tablet 5  . venlafaxine XR (EFFEXOR-XR) 75 MG 24 hr capsule Take 75 mg by mouth 2 (two) times daily.     . nitroGLYCERIN (NITROSTAT) 0.4 MG SL tablet Place 1 tablet (0.4 mg total) under the tongue every 5 (five) minutes as needed for chest pain. 25 tablet 12   No current facility-administered medications for this visit.     Allergies:   Povidone-iodine; Tramadol-acetaminophen; Codeine; and Fentanyl    Social History:  The patient  reports that she has quit smoking. She has never used smokeless tobacco. She reports that she does not drink alcohol or use drugs.   Family History:  The patient's family history includes CAD in her sister; Cancer in her father and mother; Cardiomyopathy in her sister; Diabetes in her father and paternal grandmother; Heart attack in her maternal grandfather and sister; Heart disease in her maternal grandfather and maternal grandmother; Rheum arthritis in her mother.    ROS: All other systems are reviewed and negative. Unless otherwise mentioned in H&P    PHYSICAL EXAM: VS:  BP 122/72 (BP Location: Left Arm, Patient Position:  Sitting)   Pulse (!) 54   Ht 5\' 7"  (1.702 m)   Wt 219 lb (99.3 kg)   BMI 34.30 kg/m  , BMI Body mass index is 34.3 kg/m. GEN: Well nourished, well developed, in no acute distress  HEENT: normal  Neck: no JVD, carotid bruits, or masses Cardiac: RRR; no murmurs, rubs, or gallops,no edema  Respiratory:  clear to auscultation bilaterally, normal work of breathing GI: soft, nontender, nondistended, + BS MS: no deformity or atrophy  Skin: warm and dry, no rash Neuro:  Strength and sensation are intact Psych: euthymic mood, full affect   EKG: Sinus bradycardia, rate of 54 bpm.   Recent Labs: 11/06/2017: ALT 9; BUN 21; Creatinine, Ser 1.24; Potassium 4.6; Sodium 142 11/11/2017: Hemoglobin 13.4; Platelets 217    Lipid Panel  Component Value Date/Time   CHOL 112 11/06/2017 1448   TRIG 252 (H) 11/06/2017 1448   HDL 32 (L) 11/06/2017 1448   CHOLHDL 3.5 11/06/2017 1448   CHOLHDL 6.6 08/17/2017 0015   VLDL 53 (H) 08/17/2017 0015   LDLCALC 30 11/06/2017 1448      Wt Readings from Last 3 Encounters:  04/22/18 219 lb (99.3 kg)  11/25/17 212 lb (96.2 kg)  11/12/17 212 lb (96.2 kg)      Other studies Reviewed: Cardiac Cath 11/12/2017. Conclusion     Culprit lesion: Dist LAD lesion is 100% stenosed at the site of previous angioplasty. Plan for now is medical management.  Previously placed Mid LAD drug eluting stent is widely patent.  Mid RCA stent, widely patent .  Ost 1st Mrg lesion is 55% stenosed.  LV end diastolic pressure is normal.   Stable coronary disease with the exception of restenosis of the previously angioplastied apical LAD.  Based on the distal nature of this occlusion and the size of the vessel at this point, it is probably not a good target for redo angioplasty or stenting. Plan will be medical management --will increase metoprolol dose to 50 mg and add low-dose amlodipine to the already increased dose of Imdur 60 mg daily.  Also add Ranexa 500 mg twice  daily.     ASSESSMENT AND PLAN:  1. Dizziness: I checked orthostatic BP today. She dropped from a low baseline BP of 108/66 to 88/60. She was a little dizzy but did not have chest pain. I am going to discontinue amlodipine 2.5 mg to evaluate how she feels with higher BP. She is advised to keep hydrated.  Consideration for decreasing isosorbide, but with recurrent discomfort, will wait on this. It may be due to hypotension. Will follow in a couple of weeks. Don't think we need to pursue ischemic testing at this time. EKG is unremarkable.   2. CAD: Hx of DES to the LAD and RCA. She is on metoprolol, Plavix, ASA. Isosorbide for chronic angina and Ranexa. She will continue this. I will check BMET and CBC.   3. Hypercholesterolemia: Continue statin therapy with atorvastatin.    Current medicines are reviewed at length with the patient today.    Labs/ tests ordered today include: CBC BMET  Bettey Mare. Liborio Nixon, ANP, AACC   04/22/2018 11:58 AM    [facility] [address] [place], [place] [address] Phone: [phone]; Fax: [phone]

## 2018-04-22 ENCOUNTER — Encounter: Payer: Self-pay | Admitting: Adult Health

## 2018-04-22 ENCOUNTER — Ambulatory Visit: Payer: Medicare HMO | Admitting: Adult Health

## 2018-04-22 VITALS — BP 122/72 | HR 54 | Ht 67.0 in | Wt 219.0 lb

## 2018-04-22 DIAGNOSIS — Z79899 Other long term (current) drug therapy: Secondary | ICD-10-CM | POA: Diagnosis not present

## 2018-04-22 DIAGNOSIS — R42 Dizziness and giddiness: Secondary | ICD-10-CM | POA: Diagnosis not present

## 2018-04-22 DIAGNOSIS — I2510 Atherosclerotic heart disease of native coronary artery without angina pectoris: Secondary | ICD-10-CM | POA: Diagnosis not present

## 2018-04-22 DIAGNOSIS — I10 Essential (primary) hypertension: Secondary | ICD-10-CM

## 2018-04-22 DIAGNOSIS — E7800 Pure hypercholesterolemia, unspecified: Secondary | ICD-10-CM

## 2018-04-22 NOTE — Patient Instructions (Addendum)
Medication Instructions:  STOP AMLODIPINE   If you need a refill on your cardiac medications before your next appointment, please call your pharmacy.  Labwork: BMET,MAG AND CBC TODAY HERE IN OUR OFFICE AT [facility]  Take the provided lab slips with you to the lab for your blood draw.   Special Instructions: NAY TAKE NITRO FOR CHEST PAIN, PLEASE LET us KNOW IF YOU NOTICE INCREASED CHEST PAIN, WE MAY NEED TO ADJUST YOUR MEDICATIONS AGAIN.  Follow-Up: Your physician wants you to follow-up in: 2 WEEKS WITH DR Dale -OR- KATHRYN LAWRENCE (NURSE PRACTIONIER), DNP,AACC IF PRIMARY CARDIOLOGIST IS UNAVAILABLE.   Thank you for choosing [facility] at [facility]!!

## 2018-04-23 LAB — BASIC METABOLIC PANEL
BUN/Creatinine Ratio: 15 (ref 12–28)
BUN: 18 mg/dL (ref 8–27)
CO2: 31 mmol/L — ABNORMAL HIGH (ref 20–29)
CREATININE: 1.21 mg/dL — AB (ref 0.57–1.00)
Calcium: 9.6 mg/dL (ref 8.7–10.3)
Chloride: 96 mmol/L (ref 96–106)
GFR, EST AFRICAN AMERICAN: 53 mL/min/{1.73_m2} — AB (ref >59)
GFR, EST NON AFRICAN AMERICAN: 46 mL/min/{1.73_m2} — AB (ref >59)
Glucose: 140 mg/dL — ABNORMAL HIGH (ref 65–99)
Potassium: 4.6 mmol/L (ref 3.5–5.2)
SODIUM: 142 mmol/L (ref 134–144)

## 2018-04-23 LAB — MAGNESIUM: Magnesium: 2.3 mg/dL (ref 1.6–2.3)

## 2018-04-23 LAB — CBC
HEMOGLOBIN: 13 g/dL (ref 11.1–15.9)
Hematocrit: 41 % (ref 34.0–46.6)
MCH: 27.4 pg (ref 26.6–33.0)
MCHC: 31.7 g/dL (ref 31.5–35.7)
MCV: 86 fL (ref 79–97)
Platelets: 232 10*3/uL (ref 150–450)
RBC: 4.75 x10E6/uL (ref 3.77–5.28)
RDW: 13.6 % (ref 12.3–15.4)
WBC: 11.1 10*3/uL — AB (ref 3.4–10.8)

## 2018-05-06 ENCOUNTER — Ambulatory Visit: Payer: Medicare HMO | Admitting: Adult Health

## 2019-10-20 IMAGING — DX DG CHEST 2V
2 series · 2 of 2 positions shown · non-contrast
Comparison: None.

CLINICAL DATA: 66-year-old female with chest pain, midsternal
radiating to left arm and neck.

EXAM:
CHEST  2 VIEW

[chest pa]
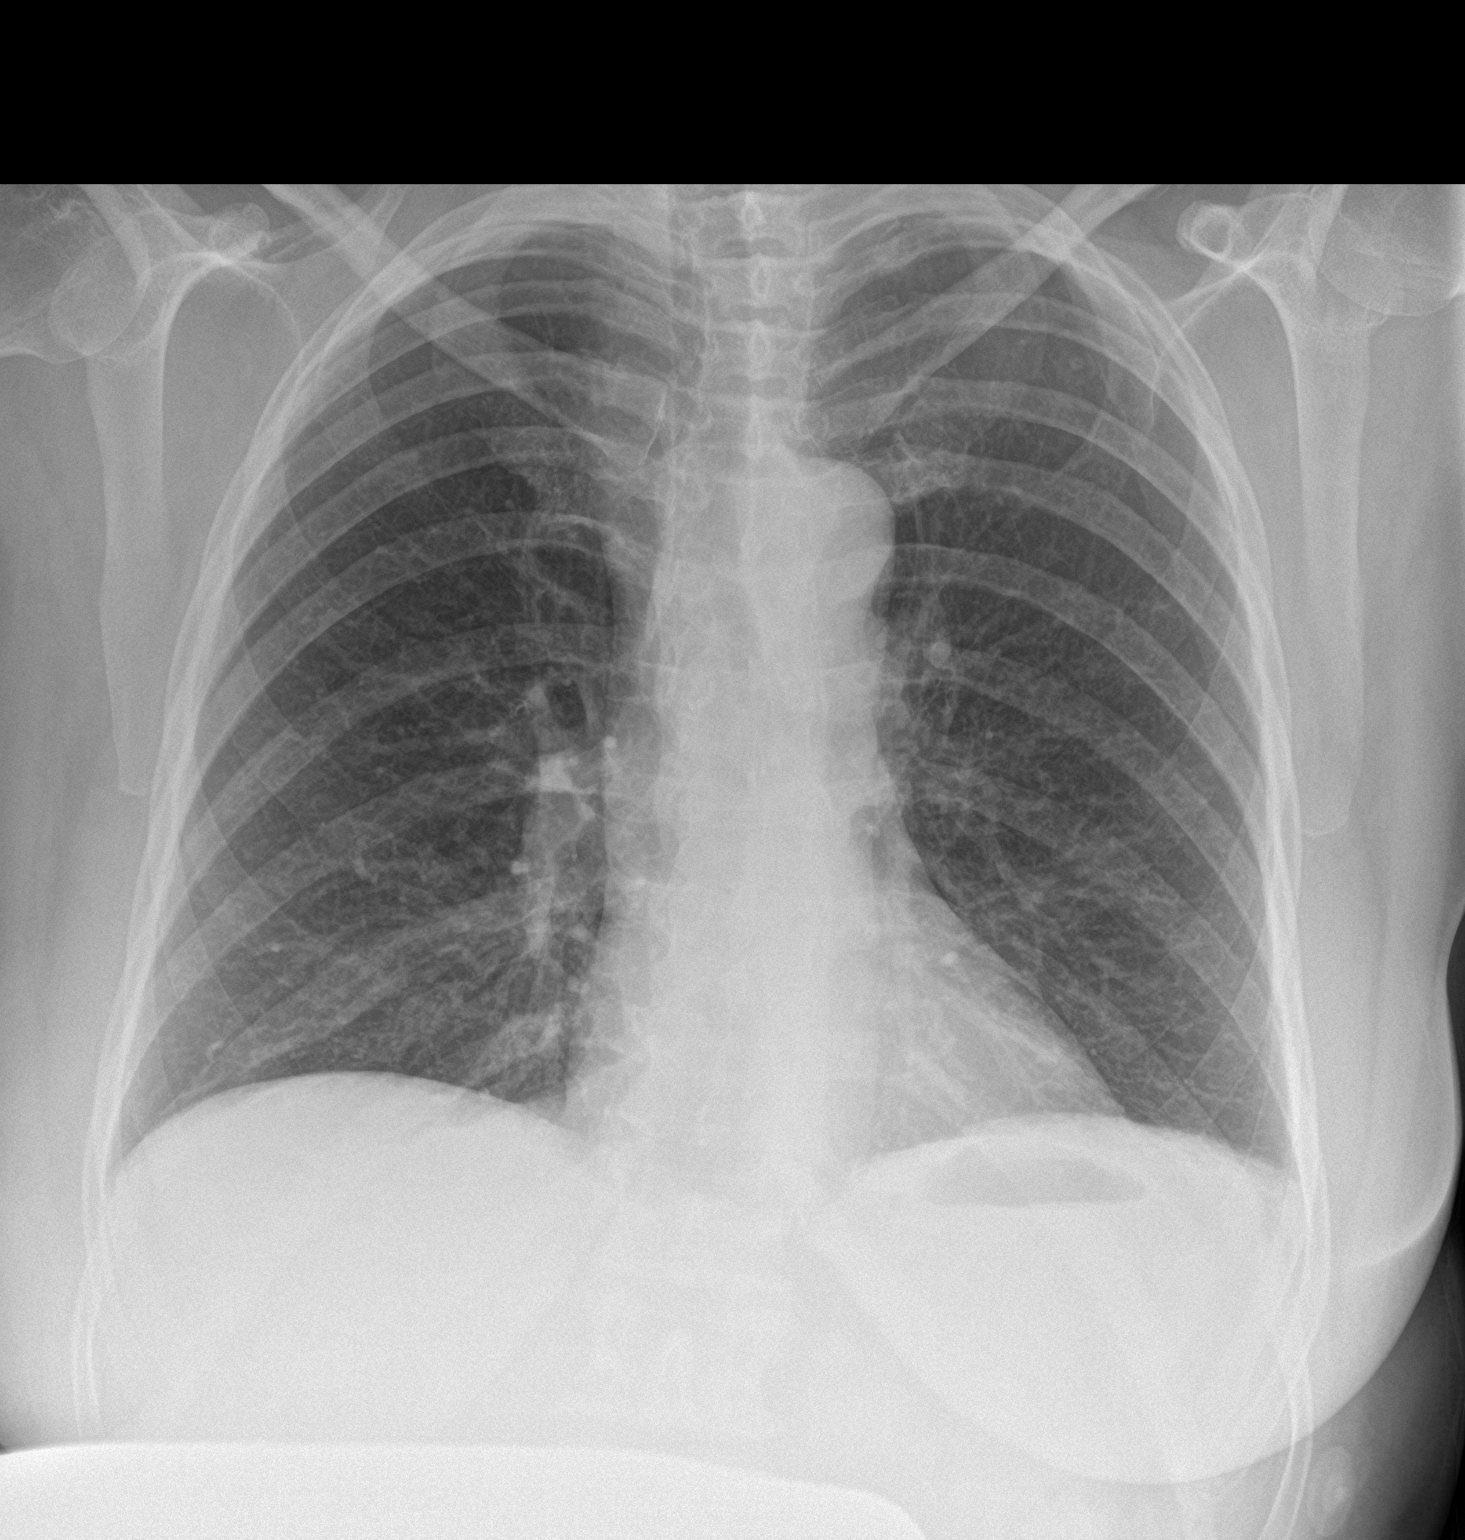

[chest lat]
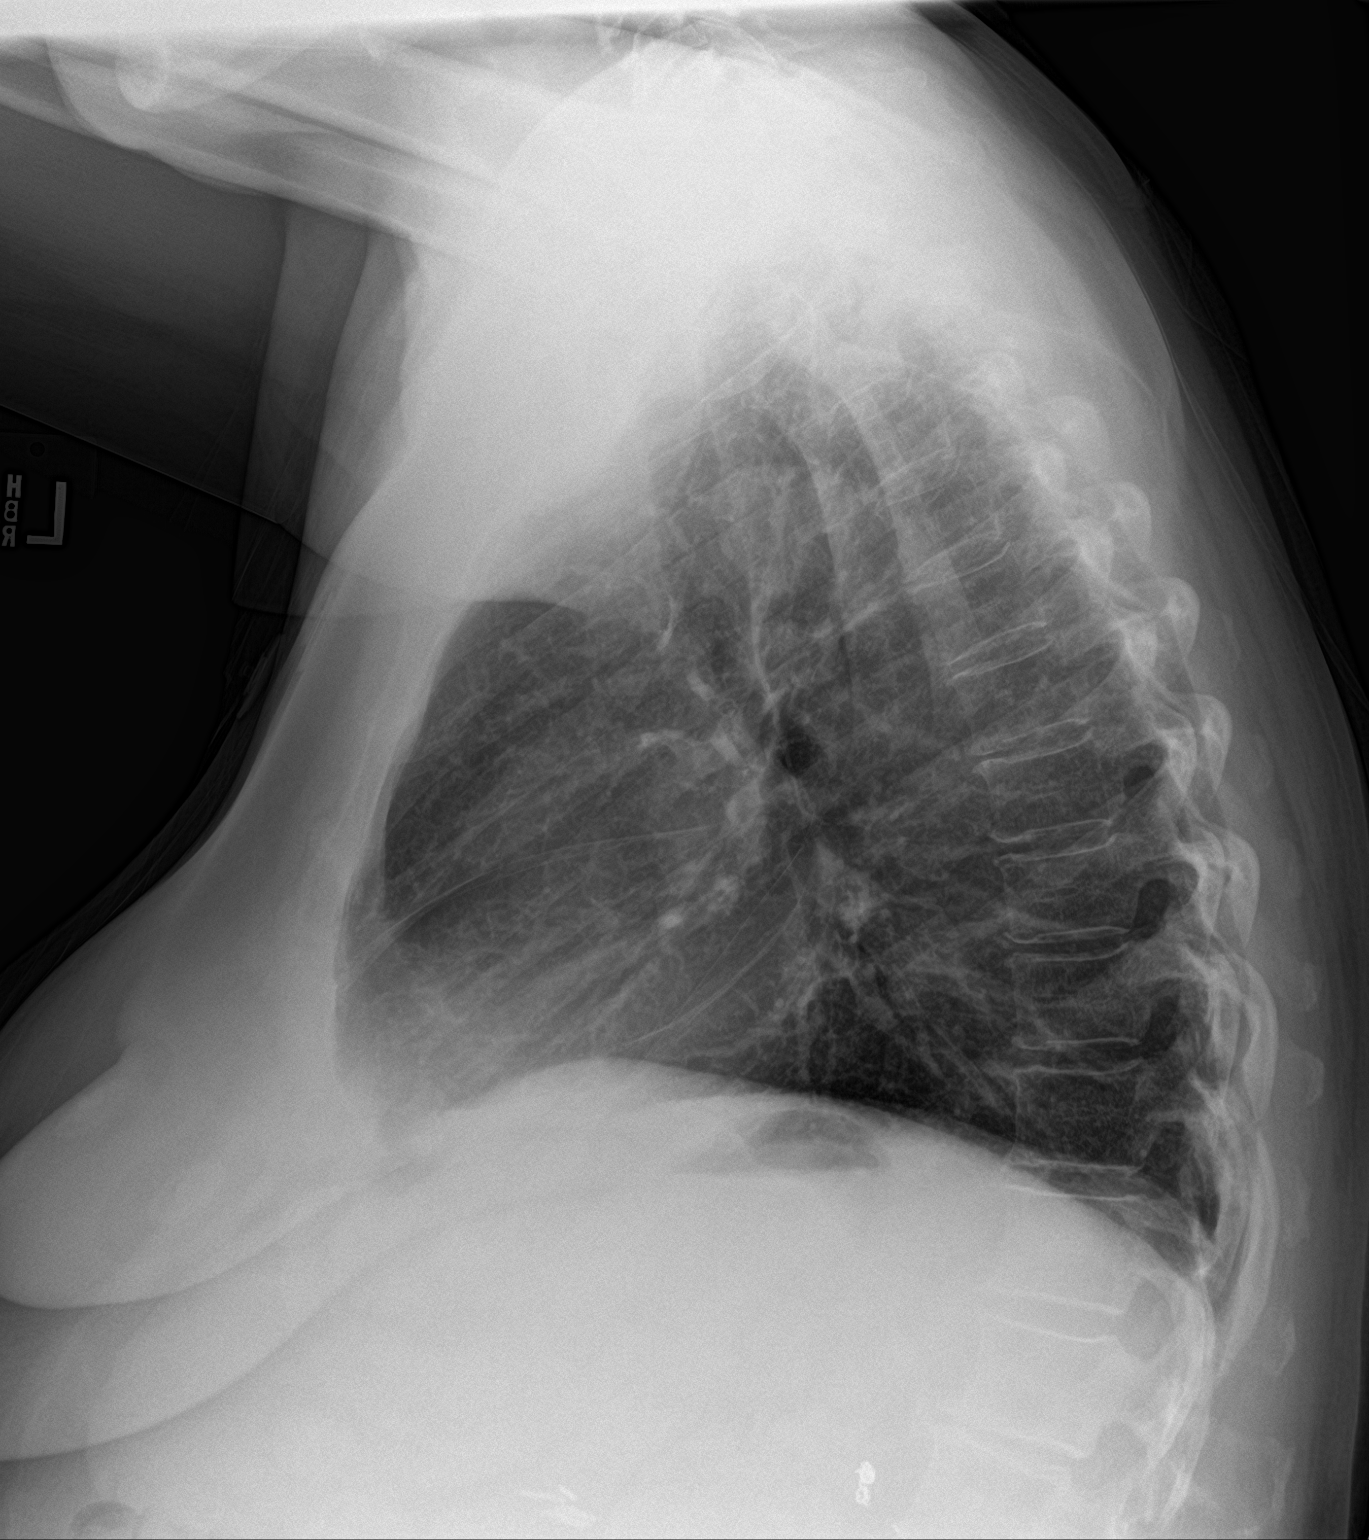

[2 of 2 positions shown; findings below may reference images not displayed]

FINDINGS: The heart size and mediastinal contours are within normal limits.
Both lungs are clear. The visualized skeletal structures are
unremarkable.
IMPRESSION: No active cardiopulmonary disease.
